# Patient Record
Sex: Male | Born: 1950 | Race: White | Hispanic: No | Smoking: Never smoker
Health system: Southern US, Community
[De-identification: ages and names within clinical notes are randomized; demographics above are authoritative.]

## PROBLEM LIST (undated history)

## (undated) DIAGNOSIS — K5792 Diverticulitis of intestine, part unspecified, without perforation or abscess without bleeding: Secondary | ICD-10-CM

## (undated) DIAGNOSIS — E7800 Pure hypercholesterolemia, unspecified: Secondary | ICD-10-CM

## (undated) HISTORY — DX: Diverticulitis of intestine, part unspecified, without perforation or abscess without bleeding: K57.92

## (undated) HISTORY — PX: TONSILLECTOMY: SUR1361

---

## 2008-08-16 ENCOUNTER — Other Ambulatory Visit: Payer: Self-pay | Admitting: Emergency Medicine

## 2008-08-16 ENCOUNTER — Emergency Department (HOSPITAL_COMMUNITY): Admission: EM | Admit: 2008-08-16 | Discharge: 2008-08-17 | Payer: Self-pay | Admitting: Emergency Medicine

## 2010-05-14 LAB — URINE MICROSCOPIC-ADD ON

## 2010-05-14 LAB — URINALYSIS, ROUTINE W REFLEX MICROSCOPIC
Bilirubin Urine: NEGATIVE
Nitrite: NEGATIVE
Specific Gravity, Urine: 1.026 (ref 1.005–1.030)
Urobilinogen, UA: 1 mg/dL (ref 0.0–1.0)

## 2013-02-24 ENCOUNTER — Encounter (HOSPITAL_COMMUNITY): Payer: Self-pay | Admitting: Emergency Medicine

## 2013-02-24 ENCOUNTER — Emergency Department (HOSPITAL_COMMUNITY)
Admission: EM | Admit: 2013-02-24 | Discharge: 2013-02-24 | Disposition: A | Discharge status: Home / Self Care | Payer: BC Managed Care – PPO | Attending: Emergency Medicine | Admitting: Emergency Medicine

## 2013-02-24 DIAGNOSIS — Z8639 Personal history of other endocrine, nutritional and metabolic disease: Secondary | ICD-10-CM | POA: Insufficient documentation

## 2013-02-24 DIAGNOSIS — R0989 Other specified symptoms and signs involving the circulatory and respiratory systems: Secondary | ICD-10-CM | POA: Insufficient documentation

## 2013-02-24 DIAGNOSIS — R220 Localized swelling, mass and lump, head: Secondary | ICD-10-CM | POA: Insufficient documentation

## 2013-02-24 DIAGNOSIS — Z862 Personal history of diseases of the blood and blood-forming organs and certain disorders involving the immune mechanism: Secondary | ICD-10-CM | POA: Insufficient documentation

## 2013-02-24 DIAGNOSIS — T7840XA Allergy, unspecified, initial encounter: Secondary | ICD-10-CM

## 2013-02-24 DIAGNOSIS — R221 Localized swelling, mass and lump, neck: Principal | ICD-10-CM

## 2013-02-24 DIAGNOSIS — R0609 Other forms of dyspnea: Secondary | ICD-10-CM | POA: Insufficient documentation

## 2013-02-24 DIAGNOSIS — I10 Essential (primary) hypertension: Secondary | ICD-10-CM | POA: Insufficient documentation

## 2013-02-24 DIAGNOSIS — R42 Dizziness and giddiness: Secondary | ICD-10-CM | POA: Insufficient documentation

## 2013-02-24 HISTORY — DX: Pure hypercholesterolemia, unspecified: E78.00

## 2013-02-24 MED ORDER — FAMOTIDINE IN NACL 20-0.9 MG/50ML-% IV SOLN
20.0000 mg | Freq: Once | INTRAVENOUS | Status: AC
  Administered 2013-02-24: 20 mg via INTRAVENOUS
  Filled 2013-02-24: qty 50

## 2013-02-24 MED ORDER — DIPHENHYDRAMINE HCL 25 MG PO TABS: 25.0000 mg | ORAL_TABLET | Freq: Three times a day (TID) | ORAL | Status: DC

## 2013-02-24 MED ORDER — FAMOTIDINE 40 MG PO TABS: 40.0000 mg | ORAL_TABLET | Freq: Every day | ORAL | Status: DC

## 2013-02-24 MED ORDER — METHYLPREDNISOLONE SODIUM SUCC 125 MG IJ SOLR
INTRAMUSCULAR | Status: AC
  Administered 2013-02-24: 125 mg
  Filled 2013-02-24: qty 2

## 2013-02-24 MED ORDER — DIPHENHYDRAMINE HCL 50 MG/ML IJ SOLN
INTRAMUSCULAR | Status: AC
  Administered 2013-02-24: 25 mg
  Filled 2013-02-24: qty 1

## 2013-02-24 MED ORDER — SODIUM CHLORIDE 0.9 % IV BOLUS (SEPSIS)
1000.0000 mL | Freq: Once | INTRAVENOUS | Status: AC
  Administered 2013-02-24: 1000 mL via INTRAVENOUS

## 2013-02-24 NOTE — ED Notes (Signed)
Pt reports tongue swelling and feeling "flushed and itchy" x 15 minutes ago.

## 2013-02-24 NOTE — ED Provider Notes (Signed)
CSN: [id number]     Arrival date & time 02/24/13  2993 History  This chart was scribed for Carmin Muskrat, MD by Rolanda Lundborg, ED Scribe. This patient was seen in room APA14/APA14 and the patient's care was started at 8:11 AM.    Chief Complaint  Patient presents with  . Allergic Reaction   The history is provided by the patient. No language interpreter was used.   HPI Comments: Russell Mckinney is a 63 y.o. male who presents to the Emergency Department complaining of sudden-onset allergic reaction onset 40 minutes ago while playing chess on his computer. He reports moderate tongue swelling, mild difficulty breathing, flushed skin, and lightheadedness. He reports recent illness and took OTC Dayquil last night. He denies doing or consuming anything unusual this morning. He denies any pain. He did not take any medication at home. He has no known allergies. He is otherwise healthy. He is not on any medications currently.      Past Medical History  Diagnosis Date  . High cholesterol    Past Surgical History  Procedure Laterality Date  . Tonsillectomy     No family history on file. History  Substance Use Topics  . Smoking status: Never Smoker   . Smokeless tobacco: Not on file  . Alcohol Use: No    Review of Systems  Constitutional:       Per HPI, otherwise negative  HENT:       Per HPI, otherwise negative  Respiratory:       Per HPI, otherwise negative  Cardiovascular:       Per HPI, otherwise negative  Gastrointestinal: Negative for vomiting.  Endocrine:       Negative aside from HPI  Genitourinary:       Neg aside from HPI   Musculoskeletal:       Per HPI, otherwise negative  Skin: Negative.   Neurological: Negative for syncope.    Allergies  Review of patient's allergies indicates no known allergies.  Home Medications  No current outpatient prescriptions on file. BP 135/83  Pulse 78  Temp(Src) 98 F (36.7 C)  Resp 18  Ht 5' 10.5" (1.791 m)  Wt 192 lb (87.091  kg)  BMI 27.15 kg/m2  SpO2 98% Physical Exam  Nursing note and vitals reviewed. Constitutional: He is oriented to person, place, and time. He appears well-developed. No distress.  HENT:  Head: Normocephalic and atraumatic.  Uvula midline non edematous in the back  Eyes: Conjunctivae and EOM are normal.  Cardiovascular: Normal rate and regular rhythm.   Pulmonary/Chest: Effort normal. No stridor. No respiratory distress.  Abdominal: He exhibits no distension.  Musculoskeletal: He exhibits no edema.  Neurological: He is alert and oriented to person, place, and time.  Skin: Skin is warm and dry.  Psychiatric: He has a normal mood and affect.    ED Course  Procedures (including critical care time) Medications - No data to display  DIAGNOSTIC STUDIES: Oxygen Saturation is 98% on RA, normal by my interpretation.    COORDINATION OF CARE: 8:16 AM- Discussed treatment plan with pt which includes Pepcid, benadryl, solumedrol. Pt agrees to plan.    Labs Review Labs Reviewed - No data to display Imaging Review No results found.  EKG Interpretation   None      11:09 AM Patient in no distress.  Tongue swelling is decreased. MDM  No diagnosis found.  I personally performed the services described in this documentation, which was scribed in my presence. The recorded  information has been reviewed and is accurate.   She presents after a likely allergic reaction.  If the patient has glossal edema, but no posterior oral pharyngeal edema or respiratory distress.  Patient improved subjectively here, had no evidence of decompensation.  Though the precipitant is an unknown, the patient substantial improvement and was appropriate for discharge with outpatient followup as needed.  He voices an understanding of return precautions.  Carmin Muskrat, MD 02/24/13 1110

## 2013-02-24 NOTE — Discharge Instructions (Signed)
As discussed, it is important to monitor your condition carefully, and do not hesitate to return here if you develop new, or concerning changes in your condition.  You may use over the counter medications in lieu of the prescriptions, but please follow the instructions for dosing.

## 2013-11-08 DIAGNOSIS — E781 Pure hyperglyceridemia: Secondary | ICD-10-CM | POA: Insufficient documentation

## 2013-11-08 DIAGNOSIS — R42 Dizziness and giddiness: Secondary | ICD-10-CM | POA: Insufficient documentation

## 2013-11-08 DIAGNOSIS — F528 Other sexual dysfunction not due to a substance or known physiological condition: Secondary | ICD-10-CM | POA: Insufficient documentation

## 2013-11-08 DIAGNOSIS — L723 Sebaceous cyst: Secondary | ICD-10-CM | POA: Insufficient documentation

## 2013-11-08 DIAGNOSIS — D126 Benign neoplasm of colon, unspecified: Secondary | ICD-10-CM | POA: Insufficient documentation

## 2013-11-08 DIAGNOSIS — R220 Localized swelling, mass and lump, head: Secondary | ICD-10-CM | POA: Insufficient documentation

## 2013-11-08 DIAGNOSIS — M25519 Pain in unspecified shoulder: Secondary | ICD-10-CM | POA: Insufficient documentation

## 2013-11-08 DIAGNOSIS — K5900 Constipation, unspecified: Secondary | ICD-10-CM | POA: Insufficient documentation

## 2015-07-28 ENCOUNTER — Encounter (HOSPITAL_COMMUNITY): Payer: Self-pay | Admitting: Emergency Medicine

## 2015-07-28 ENCOUNTER — Emergency Department (HOSPITAL_COMMUNITY)
Admission: EM | Admit: 2015-07-28 | Discharge: 2015-07-28 | Disposition: A | Discharge status: Home / Self Care | Payer: BLUE CROSS/BLUE SHIELD | Attending: Emergency Medicine | Admitting: Emergency Medicine

## 2015-07-28 ENCOUNTER — Emergency Department (HOSPITAL_COMMUNITY): Payer: BLUE CROSS/BLUE SHIELD

## 2015-07-28 DIAGNOSIS — Z79899 Other long term (current) drug therapy: Secondary | ICD-10-CM | POA: Diagnosis not present

## 2015-07-28 DIAGNOSIS — K5733 Diverticulitis of large intestine without perforation or abscess with bleeding: Secondary | ICD-10-CM

## 2015-07-28 DIAGNOSIS — R109 Unspecified abdominal pain: Secondary | ICD-10-CM | POA: Diagnosis present

## 2015-07-28 LAB — CBC WITH DIFFERENTIAL/PLATELET
Basophils Absolute: 0 10*3/uL (ref 0.0–0.1)
Basophils Relative: 0 %
Eosinophils Absolute: 0 10*3/uL (ref 0.0–0.7)
Eosinophils Relative: 0 %
HCT: 42.4 % (ref 39.0–52.0)
Hemoglobin: 14.5 g/dL (ref 13.0–17.0)
Lymphocytes Relative: 15 %
Lymphs Abs: 1.9 10*3/uL (ref 0.7–4.0)
MCH: 30 pg (ref 26.0–34.0)
MCHC: 34.2 g/dL (ref 30.0–36.0)
MCV: 87.8 fL (ref 78.0–100.0)
Monocytes Absolute: 1 10*3/uL (ref 0.1–1.0)
Monocytes Relative: 8 %
Neutro Abs: 9.9 10*3/uL — ABNORMAL HIGH (ref 1.7–7.7)
Neutrophils Relative %: 77 %
Platelets: 188 10*3/uL (ref 150–400)
RBC: 4.83 MIL/uL (ref 4.22–5.81)
RDW: 13 % (ref 11.5–15.5)
WBC: 12.8 10*3/uL — ABNORMAL HIGH (ref 4.0–10.5)

## 2015-07-28 LAB — URINE MICROSCOPIC-ADD ON
Bacteria, UA: NONE SEEN
Squamous Epithelial / LPF: NONE SEEN
WBC, UA: NONE SEEN WBC/hpf (ref 0–5)

## 2015-07-28 LAB — BASIC METABOLIC PANEL
Anion gap: 8 (ref 5–15)
BUN: 10 mg/dL (ref 6–20)
CO2: 28 mmol/L (ref 22–32)
Calcium: 9 mg/dL (ref 8.9–10.3)
Chloride: 101 mmol/L (ref 101–111)
Creatinine, Ser: 1.06 mg/dL (ref 0.61–1.24)
GFR calc Af Amer: >60 mL/min (ref >60)
GFR calc non Af Amer: >60 mL/min (ref >60)
Glucose, Bld: 99 mg/dL (ref 65–99)
Potassium: 3.8 mmol/L (ref 3.5–5.1)
Sodium: 137 mmol/L (ref 135–145)

## 2015-07-28 LAB — URINALYSIS, ROUTINE W REFLEX MICROSCOPIC
Bilirubin Urine: NEGATIVE
Glucose, UA: NEGATIVE mg/dL
Ketones, ur: NEGATIVE mg/dL
Leukocytes, UA: NEGATIVE
Nitrite: NEGATIVE
Protein, ur: NEGATIVE mg/dL
Specific Gravity, Urine: <1.005 — ABNORMAL LOW (ref 1.005–1.030)
pH: 6.5 (ref 5.0–8.0)

## 2015-07-28 MED ORDER — DIATRIZOATE MEGLUMINE & SODIUM 66-10 % PO SOLN
ORAL | Status: AC
  Filled 2015-07-28: qty 30

## 2015-07-28 MED ORDER — METRONIDAZOLE IN NACL 5-0.79 MG/ML-% IV SOLN
500.0000 mg | Freq: Once | INTRAVENOUS | Status: AC
  Administered 2015-07-28: 500 mg via INTRAVENOUS
  Filled 2015-07-28: qty 100

## 2015-07-28 MED ORDER — IOPAMIDOL (ISOVUE-300) INJECTION 61%
100.0000 mL | Freq: Once | INTRAVENOUS | Status: AC | PRN
  Administered 2015-07-28: 100 mL via INTRAVENOUS

## 2015-07-28 MED ORDER — CIPROFLOXACIN HCL 500 MG PO TABS: 500.0000 mg | ORAL_TABLET | Freq: Two times a day (BID) | ORAL | Status: DC

## 2015-07-28 MED ORDER — SODIUM CHLORIDE 0.9 % IV BOLUS (SEPSIS)
1000.0000 mL | Freq: Once | INTRAVENOUS | Status: AC
  Administered 2015-07-28: 1000 mL via INTRAVENOUS

## 2015-07-28 MED ORDER — DICYCLOMINE HCL 10 MG PO CAPS
10.0000 mg | ORAL_CAPSULE | Freq: Once | ORAL | Status: AC
  Administered 2015-07-28: 10 mg via ORAL
  Filled 2015-07-28: qty 1

## 2015-07-28 MED ORDER — KETOROLAC TROMETHAMINE 30 MG/ML IJ SOLN
15.0000 mg | Freq: Once | INTRAMUSCULAR | Status: AC
  Administered 2015-07-28: 15 mg via INTRAVENOUS
  Filled 2015-07-28: qty 1

## 2015-07-28 MED ORDER — CIPROFLOXACIN IN D5W 400 MG/200ML IV SOLN
400.0000 mg | Freq: Once | INTRAVENOUS | Status: AC
  Administered 2015-07-28: 400 mg via INTRAVENOUS
  Filled 2015-07-28: qty 200

## 2015-07-28 MED ORDER — OXYCODONE-ACETAMINOPHEN 5-325 MG PO TABS: 1.0000 | ORAL_TABLET | ORAL | Status: DC | PRN

## 2015-07-28 MED ORDER — METRONIDAZOLE 500 MG PO TABS: 500.0000 mg | ORAL_TABLET | Freq: Three times a day (TID) | ORAL | Status: DC

## 2015-07-28 NOTE — ED Notes (Signed)
Pt verbalized understanding of no driving and to use caution within 4 hours of taking pain meds due to meds cause drowsiness 

## 2015-07-28 NOTE — ED Notes (Signed)
Over the past 4 months pt had abdominal pain and after BM, pain would go away.  For the past 4 days pain has been constant.  Rates pain 2/10 but increases to 6/10.  Last BM 1545, with bright red blood.

## 2015-07-28 NOTE — Discharge Instructions (Signed)
Diverticulitis °Diverticulitis is inflammation or infection of small pouches in your colon that form when you have a condition called diverticulosis. The pouches in your colon are called diverticula. Your colon, or large intestine, is where water is absorbed and stool is formed. °Complications of diverticulitis can include: °· Bleeding. °· Severe infection. °· Severe pain. °· Perforation of your colon. °· Obstruction of your colon. °CAUSES  °Diverticulitis is caused by bacteria. °Diverticulitis happens when stool becomes trapped in diverticula. This allows bacteria to grow in the diverticula, which can lead to inflammation and infection. °RISK FACTORS °People with diverticulosis are at risk for diverticulitis. Eating a diet that does not include enough fiber from fruits and vegetables may make diverticulitis more likely to develop. °SYMPTOMS  °Symptoms of diverticulitis may include: °· Abdominal pain and tenderness. The pain is normally located on the left side of the abdomen, but may occur in other areas. °· Fever and chills. °· Bloating. °· Cramping. °· Nausea. °· Vomiting. °· Constipation. °· Diarrhea. °· Blood in your stool. °DIAGNOSIS  °Your health care provider will ask you about your medical history and do a physical exam. You may need to have tests done because many medical conditions can cause the same symptoms as diverticulitis. Tests may include: °· Blood tests. °· Urine tests. °· Imaging tests of the abdomen, including X-rays and CT scans. °When your condition is under control, your health care provider may recommend that you have a colonoscopy. A colonoscopy can show how severe your diverticula are and whether something else is causing your symptoms. °TREATMENT  °Most cases of diverticulitis are mild and can be treated at home. Treatment may include: °· Taking over-the-counter pain medicines. °· Following a clear liquid diet. °· Taking antibiotic medicines by mouth for 7-10 days. °More severe cases may  be treated at a hospital. Treatment may include: °· Not eating or drinking. °· Taking prescription pain medicine. °· Receiving antibiotic medicines through an IV tube. °· Receiving fluids and nutrition through an IV tube. °· Surgery. °HOME CARE INSTRUCTIONS  °· Follow your health care provider's instructions carefully. °· Follow a full liquid diet or other diet as directed by your health care provider. After your symptoms improve, your health care provider may tell you to change your diet. He or she may recommend you eat a high-fiber diet. Fruits and vegetables are good sources of fiber. Fiber makes it easier to pass stool. °· Take fiber supplements or probiotics as directed by your health care provider. °· Only take medicines as directed by your health care provider. °· Keep all your follow-up appointments. °SEEK MEDICAL CARE IF:  °· Your pain does not improve. °· You have a hard time eating food. °· Your bowel movements do not return to normal. °SEEK IMMEDIATE MEDICAL CARE IF:  °· Your pain becomes worse. °· Your symptoms do not get better. °· Your symptoms suddenly get worse. °· You have a fever. °· You have repeated vomiting. °· You have bloody or black, tarry stools. °MAKE SURE YOU:  °· Understand these instructions. °· Will watch your condition. °· Will get help right away if you are not doing well or get worse. °  °This information is not intended to replace advice given to you by your health care provider. Make sure you discuss any questions you have with your health care provider. °  °Document Released: 10/31/2004 Document Revised: 01/26/2013 Document Reviewed: 12/16/2012 °Elsevier Interactive Patient Education ©2016 [employer] ° °

## 2015-08-03 NOTE — ED Provider Notes (Signed)
CSN: [id number]     Arrival date & time 07/28/15  1649 History   First MD Initiated Contact with Patient 07/28/15 1704     Chief Complaint  Patient presents with  . Abdominal Pain     (Consider location/radiation/quality/duration/timing/severity/associated sxs/prior Treatment) HPI   65 year old male with abdominal pain. He reports that over the last several months he had intermittent abdominal pain. It waxed and waned with no particular rhyme or reason but he did notice that it was often better shortly after having a bowel movement. In the past 4 days though he has had similar but more severe pain. It has not improved with having bowel movements. Is worse with movement. He has also noticed a small amount of bright red blood mixed in with this stool. No urinary complaints. No fevers or chills. Mild nausea, but no vomiting.  Past Medical History  Diagnosis Date  . High cholesterol    Past Surgical History  Procedure Laterality Date  . Tonsillectomy     History reviewed. No pertinent family history. Social History  Substance Use Topics  . Smoking status: Never Smoker   . Smokeless tobacco: None  . Alcohol Use: No    Review of Systems  All systems reviewed and negative, other than as noted in HPI.   Allergies  Review of patient's allergies indicates no known allergies.  Home Medications   Prior to Admission medications   Medication Sig Start Date End Date Taking? Authorizing Provider  senna-docusate (SENOKOT-S) 8.6-50 MG tablet Take 1 tablet by mouth once as needed for mild constipation.   Yes Historical Provider, MD  ciprofloxacin (CIPRO) 500 MG tablet Take 1 tablet (500 mg total) by mouth every 12 (twelve) hours. 07/28/15   Virgel Manifold, MD  metroNIDAZOLE (FLAGYL) 500 MG tablet Take 1 tablet (500 mg total) by mouth 3 (three) times daily. 07/28/15   Virgel Manifold, MD  oxyCODONE-acetaminophen (PERCOCET/ROXICET) 5-325 MG tablet Take 1-2 tablets by mouth every 4 (four) hours as  needed. 07/28/15   Virgel Manifold, MD   BP 104/60 mmHg  Pulse 66  Temp(Src) 98.2 F (36.8 C)  Resp 16  Ht 5\' 10"  (1.778 m)  Wt 190 lb (86.183 kg)  BMI 27.26 kg/m2  SpO2 97% Physical Exam  Constitutional: He appears well-developed and well-nourished. No distress.  HENT:  Head: Normocephalic and atraumatic.  Eyes: Conjunctivae are normal. Right eye exhibits no discharge. Left eye exhibits no discharge.  Neck: Neck supple.  Cardiovascular: Normal rate, regular rhythm and normal heart sounds.  Exam reveals no gallop and no friction rub.   No murmur heard. Pulmonary/Chest: Effort normal and breath sounds normal. No respiratory distress.  Abdominal: Soft. He exhibits no distension. There is tenderness.  Lower abdominal tenderness, somewhat worse suprapubically. No rebound or guarding. No distention.  Musculoskeletal: He exhibits no edema or tenderness.  Neurological: He is alert.  Skin: Skin is warm and dry.  Psychiatric: He has a normal mood and affect. His behavior is normal. Thought content normal.  Nursing note and vitals reviewed.   ED Course  Procedures (including critical care time) Labs Review Labs Reviewed  CBC WITH DIFFERENTIAL/PLATELET - Abnormal; Notable for the following:    WBC 12.8 (*)    Neutro Abs 9.9 (*)    All other components within normal limits  URINALYSIS, ROUTINE W REFLEX MICROSCOPIC (NOT AT [facility]) - Abnormal; Notable for the following:    Specific Gravity, Urine <1.005 (*)    Hgb urine dipstick TRACE (*)  All other components within normal limits  BASIC METABOLIC PANEL  URINE MICROSCOPIC-ADD ON    Imaging Review No results found. I have personally reviewed and evaluated these images and lab results as part of my medical decision-making.   EKG Interpretation None      MDM   Final diagnoses:  Diverticulitis of large intestine without perforation or abscess with bleeding    65 year old male with symptoms, exam and CT consistent with  diverticulitis. No perforation or abscess noted. He is afebrile. No peritonitis. Reports sniffily improved symptoms after medications the emergency room. I feel is appropriate for outpatient treatment. He will be placed on a course of ciprofloxacin, metronidazole and as needed pain medication. Return cautions were discussed. Patient understands the need for follow-up for discussion of possible colonoscopy after he finishes his antibiotics. It has been determined that no acute conditions requiring further emergency intervention are present at this time. The patient has been advised of the diagnosis and plan. I reviewed any labs and imaging including any potential incidental findings. We have discussed signs and symptoms that warrant return to the ED and they are listed in the discharge instructions.    Virgel Manifold, MD 08/03/15 [phone]

## 2016-04-30 DIAGNOSIS — Z79899 Other long term (current) drug therapy: Secondary | ICD-10-CM | POA: Insufficient documentation

## 2016-04-30 DIAGNOSIS — Z7189 Other specified counseling: Secondary | ICD-10-CM | POA: Insufficient documentation

## 2016-04-30 DIAGNOSIS — F4321 Adjustment disorder with depressed mood: Secondary | ICD-10-CM | POA: Insufficient documentation

## 2017-02-27 ENCOUNTER — Encounter (HOSPITAL_BASED_OUTPATIENT_CLINIC_OR_DEPARTMENT_OTHER): Payer: Self-pay | Admitting: *Deleted

## 2017-02-27 ENCOUNTER — Other Ambulatory Visit: Payer: Self-pay

## 2017-02-27 ENCOUNTER — Emergency Department (HOSPITAL_BASED_OUTPATIENT_CLINIC_OR_DEPARTMENT_OTHER)
Admission: EM | Admit: 2017-02-27 | Discharge: 2017-02-27 | Disposition: A | Discharge status: Home / Self Care | Payer: BLUE CROSS/BLUE SHIELD | Attending: Emergency Medicine | Admitting: Emergency Medicine

## 2017-02-27 DIAGNOSIS — K645 Perianal venous thrombosis: Secondary | ICD-10-CM | POA: Insufficient documentation

## 2017-02-27 DIAGNOSIS — K644 Residual hemorrhoidal skin tags: Secondary | ICD-10-CM | POA: Diagnosis not present

## 2017-02-27 DIAGNOSIS — K625 Hemorrhage of anus and rectum: Secondary | ICD-10-CM | POA: Diagnosis present

## 2017-02-27 DIAGNOSIS — Z79899 Other long term (current) drug therapy: Secondary | ICD-10-CM | POA: Diagnosis not present

## 2017-02-27 DIAGNOSIS — K5900 Constipation, unspecified: Secondary | ICD-10-CM | POA: Diagnosis not present

## 2017-02-27 MED ORDER — HYDROCORTISONE 2.5 % RE CREA: TOPICAL_CREAM | RECTAL | 1 refills | Status: DC

## 2017-02-27 NOTE — ED Triage Notes (Signed)
Diarrhea tonight followed by bright red rectal bleeding that he cannot control. Nausea.

## 2017-02-27 NOTE — ED Notes (Signed)
Pt verbalizes understanding of d/c instructions and denies any further needs at this time. 

## 2017-02-27 NOTE — Discharge Instructions (Signed)
Start using fiber, stool softeners or MiraLAX as needed to make her stool softer and prevent straining. Use the cream and do sitz baths. Physician or also you can contact general surgery.If symptoms do not improve you can follow-up with your general

## 2017-02-27 NOTE — ED Provider Notes (Signed)
[facility] Provider Note   CSN: [id number] Arrival date & time: 02/27/17  2114     History   Chief Complaint Chief Complaint  Patient presents with  . Rectal Bleeding    HPI Russell Mckinney is a 67 y.o. male.  The history is provided by the patient.  Rectal Bleeding  Quality:  Bright red Amount:  Copious Duration:  1 hour Timing:  Constant Chronicity:  New Context: constipation, diarrhea and rectal pain   Pain details:    Quality:  Aching   Severity:  Mild   Timing:  Worse with defecation   Progression:  Improving Similar prior episodes: no   Relieved by:  None tried Worsened by:  Wiping Ineffective treatments:  None tried Associated symptoms: no abdominal pain, no dizziness, no fever, no recent illness and no vomiting   Risk factors: no anticoagulant use   Risk factors comment:  Hx of diverticulosis   Past Medical History:  Diagnosis Date  . High cholesterol   . High cholesterol     There are no active problems to display for this patient.   Past Surgical History:  Procedure Laterality Date  . TONSILLECTOMY         Home Medications    Prior to Admission medications   Medication Sig Start Date End Date Taking? Authorizing Provider  SIMVASTATIN PO Take by mouth.   Yes [provider]  ciprofloxacin (CIPRO) 500 MG tablet Take 1 tablet (500 mg total) by mouth every 12 (twelve) hours. 07/28/15   Virgel Manifold, MD  metroNIDAZOLE (FLAGYL) 500 MG tablet Take 1 tablet (500 mg total) by mouth 3 (three) times daily. 07/28/15   Virgel Manifold, MD  oxyCODONE-acetaminophen (PERCOCET/ROXICET) 5-325 MG tablet Take 1-2 tablets by mouth every 4 (four) hours as needed. 07/28/15   Virgel Manifold, MD  senna-docusate (SENOKOT-S) 8.6-50 MG tablet Take 1 tablet by mouth once as needed for mild constipation.    [provider]    Family History No family history on file.  Social History Social History   Tobacco Use  .  Smoking status: Never Smoker  . Smokeless tobacco: Never Used  Substance Use Topics  . Alcohol use: No  . Drug use: No     Allergies   Patient has no known allergies.   Review of Systems Review of Systems  Constitutional: Negative for fever.  Gastrointestinal: Positive for hematochezia. Negative for abdominal pain and vomiting.  Neurological: Negative for dizziness.  All other systems reviewed and are negative.    Physical Exam Updated Vital Signs BP 131/88   Pulse 75   Temp 98.1 F (36.7 C) (Oral)   Resp 16   Ht 5' 10.5" (1.791 m)   Wt 89.8 kg (198 lb)   SpO2 97%   BMI 28.01 kg/m   Physical Exam  Constitutional: He appears well-developed and well-nourished. No distress.  HENT:  Head: Normocephalic and atraumatic.  Eyes: EOM are normal. Pupils are equal, round, and reactive to light.  Cardiovascular: Normal rate.  Pulmonary/Chest: Effort normal.  Abdominal: Soft. He exhibits no distension and no mass. There is no tenderness. There is no guarding.  Genitourinary: Rectal exam shows external hemorrhoid.     Nursing note and vitals reviewed.    ED Treatments / Results  Labs (all labs ordered are listed, but only abnormal results are displayed) Labs Reviewed - No data to display  EKG  EKG Interpretation None       Radiology No results found.  Procedures Procedures (including critical care time)  Medications Ordered in ED Medications - No data to display   Initial Impression / Assessment and Plan / ED Course  I have reviewed the triage vital signs and the nursing notes.  Pertinent labs & imaging results that were available during my care of the patient were reviewed by me and considered in my medical decision making (see chart for details).     Patient is a 67 year old male who is relatively healthy other than hypercholesterolemia presenting today with bright red rectal bleeding.  Patient states that he typically has a lot of constipation and  strains often but had a loose stool today and then bright red blood on the toilet paper that continued to bleed the more he wiped.  Having any trouble urinating and otherwise feels normal.  He does not take anticoagulation.  On exam patient has numerous large hemorrhoids none of which are thrombosed.  Currently hemostatic.  Sent home to do sits baths and given her Anusol cream.  Final Clinical Impressions(s) / ED Diagnoses   Final diagnoses:  External hemorrhoids without complication    ED Discharge Orders    None       Blanchie Dessert, MD 02/27/17 2210

## 2017-03-17 DIAGNOSIS — E781 Pure hyperglyceridemia: Secondary | ICD-10-CM | POA: Diagnosis not present

## 2017-03-17 DIAGNOSIS — F4321 Adjustment disorder with depressed mood: Secondary | ICD-10-CM | POA: Diagnosis not present

## 2017-03-17 DIAGNOSIS — Z79899 Other long term (current) drug therapy: Secondary | ICD-10-CM | POA: Diagnosis not present

## 2017-04-09 DIAGNOSIS — Z0000 Encounter for general adult medical examination without abnormal findings: Secondary | ICD-10-CM | POA: Diagnosis not present

## 2017-07-30 DIAGNOSIS — Z6827 Body mass index (BMI) 27.0-27.9, adult: Secondary | ICD-10-CM | POA: Diagnosis not present

## 2017-07-30 DIAGNOSIS — D126 Benign neoplasm of colon, unspecified: Secondary | ICD-10-CM | POA: Diagnosis not present

## 2017-08-20 DIAGNOSIS — D3132 Benign neoplasm of left choroid: Secondary | ICD-10-CM | POA: Diagnosis not present

## 2017-09-04 DIAGNOSIS — Z886 Allergy status to analgesic agent status: Secondary | ICD-10-CM | POA: Diagnosis not present

## 2017-09-04 DIAGNOSIS — K5730 Diverticulosis of large intestine without perforation or abscess without bleeding: Secondary | ICD-10-CM | POA: Diagnosis not present

## 2017-09-04 DIAGNOSIS — Z86010 Personal history of colonic polyps: Secondary | ICD-10-CM | POA: Diagnosis not present

## 2017-09-04 DIAGNOSIS — K644 Residual hemorrhoidal skin tags: Secondary | ICD-10-CM | POA: Diagnosis not present

## 2017-09-04 DIAGNOSIS — K219 Gastro-esophageal reflux disease without esophagitis: Secondary | ICD-10-CM | POA: Diagnosis not present

## 2017-09-04 DIAGNOSIS — K640 First degree hemorrhoids: Secondary | ICD-10-CM | POA: Diagnosis not present

## 2017-09-04 DIAGNOSIS — K5700 Diverticulitis of small intestine with perforation and abscess without bleeding: Secondary | ICD-10-CM | POA: Diagnosis not present

## 2017-09-04 DIAGNOSIS — E785 Hyperlipidemia, unspecified: Secondary | ICD-10-CM | POA: Diagnosis not present

## 2017-09-04 DIAGNOSIS — Z1211 Encounter for screening for malignant neoplasm of colon: Secondary | ICD-10-CM | POA: Diagnosis not present

## 2017-09-04 DIAGNOSIS — Z79899 Other long term (current) drug therapy: Secondary | ICD-10-CM | POA: Diagnosis not present

## 2017-09-04 DIAGNOSIS — Z09 Encounter for follow-up examination after completed treatment for conditions other than malignant neoplasm: Secondary | ICD-10-CM | POA: Diagnosis not present

## 2017-09-04 DIAGNOSIS — K5792 Diverticulitis of intestine, part unspecified, without perforation or abscess without bleeding: Secondary | ICD-10-CM | POA: Diagnosis not present

## 2017-11-09 IMAGING — CT CT ABD-PELV W/ CM
2 of 9 series · 15 of 46 positions shown, 17 images · IV contrast (iopamidol)
Comparison: None

CLINICAL DATA: Lower abdominal pain intermittently radiating to
rectum for 4 months consistent over last 3-4 days, rectal bleeding,
question diverticulitis

EXAM:
CT ABDOMEN AND PELVIS WITH CONTRAST
TECHNIQUE: Multidetector CT imaging of the abdomen and pelvis was performed
using the standard protocol following bolus administration of
intravenous contrast. Sagittal and coronal MPR images reconstructed
from axial data set.
CONTRAST:  100mL LE420C-NQQ IOPAMIDOL (LE420C-NQQ) INJECTION 61% IV.
Dilute oral contrast.

[Series 2: routine abd pel with · axial · 0.81mm/px · z∈[-418,+6]mm · 12 of 101 slices shown, 14 images]
[im 8/101  soft-tissue]
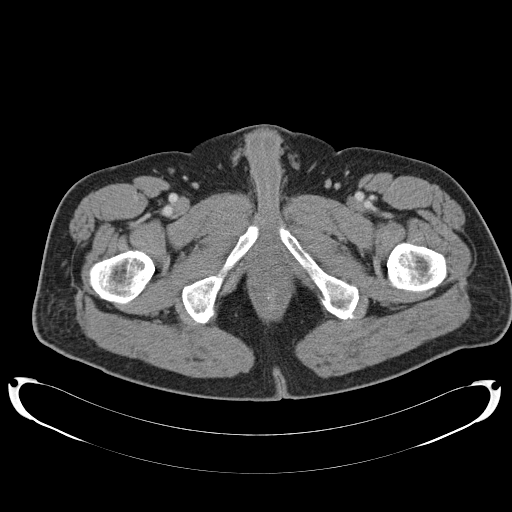
[im 8/101  bone]
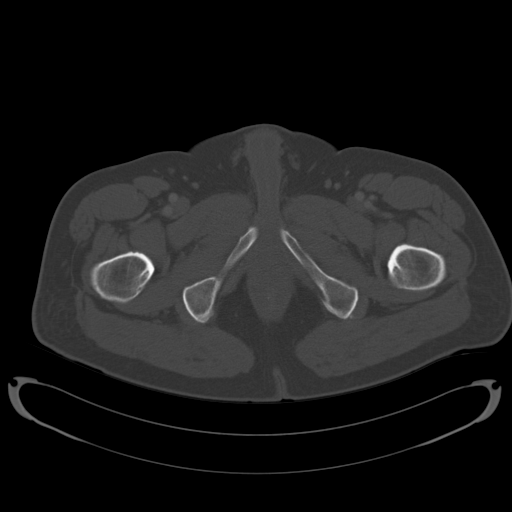
[im 15/101  soft-tissue]
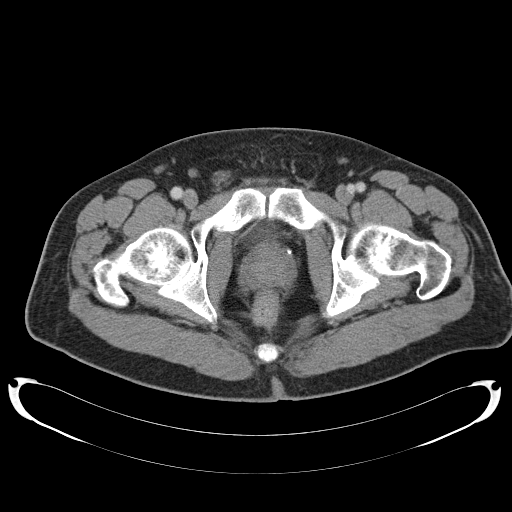
[im 22/101  soft-tissue]
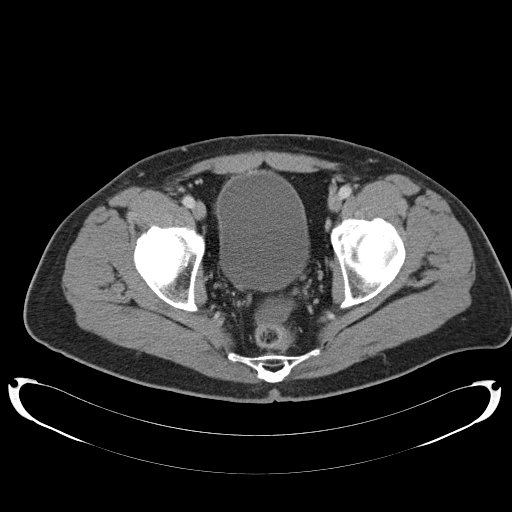
[im 29/101  soft-tissue]
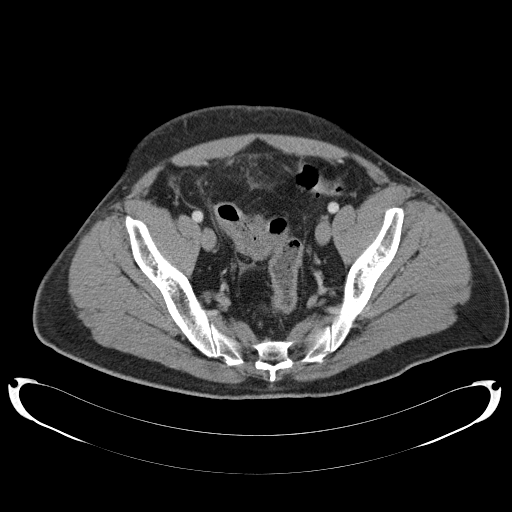
[im 36/101  soft-tissue]
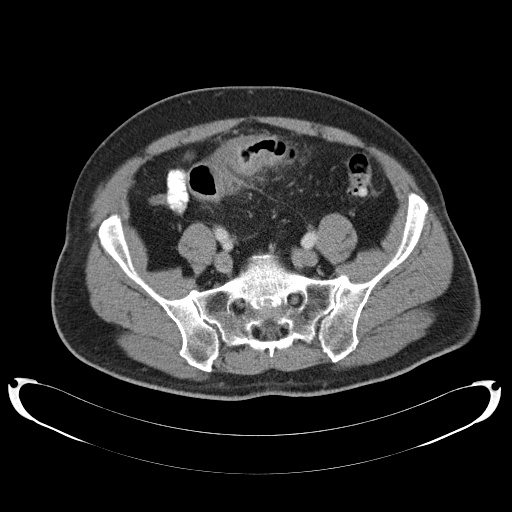
[im 43/101  soft-tissue]
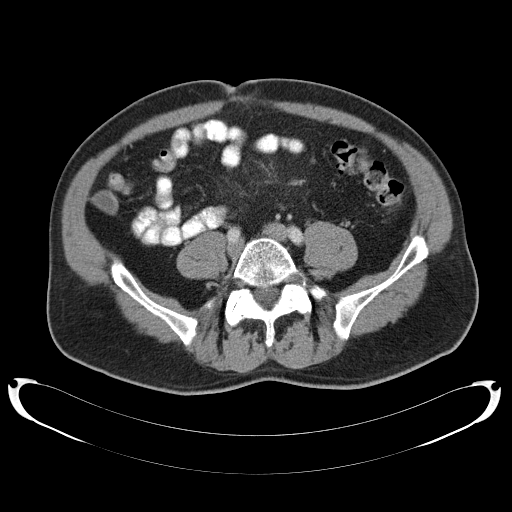
[im 58/101  soft-tissue]
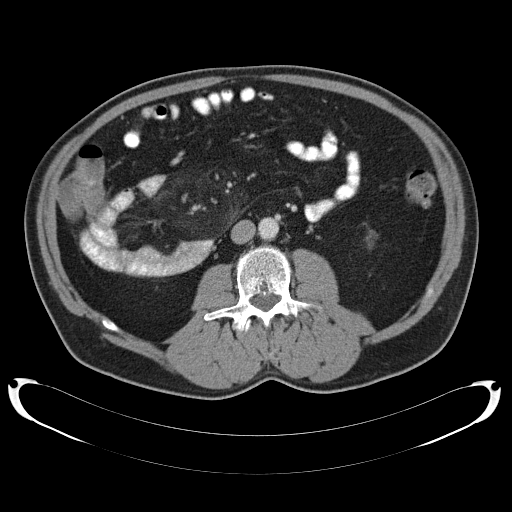
[im 65/101  soft-tissue]
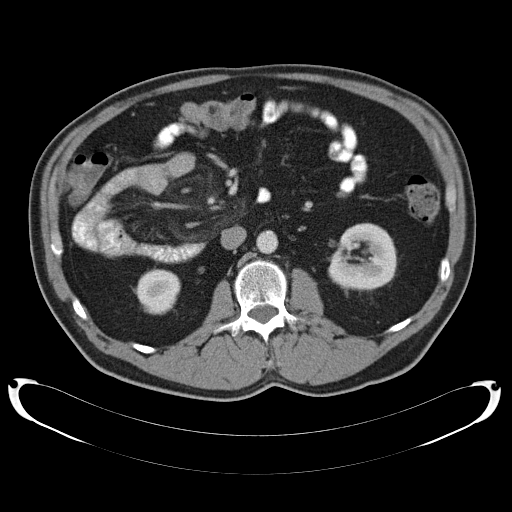
[im 72/101  soft-tissue]
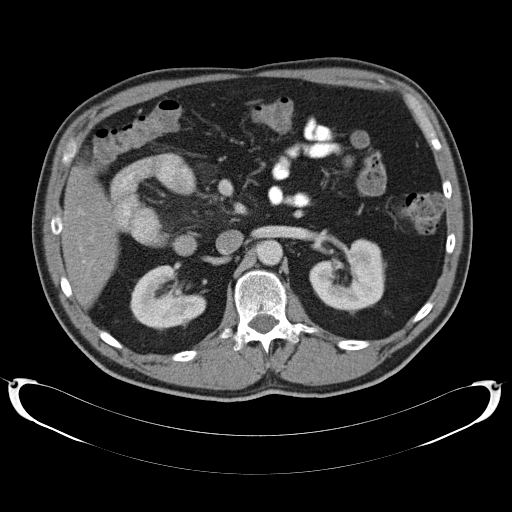
[im 72/101  bone]
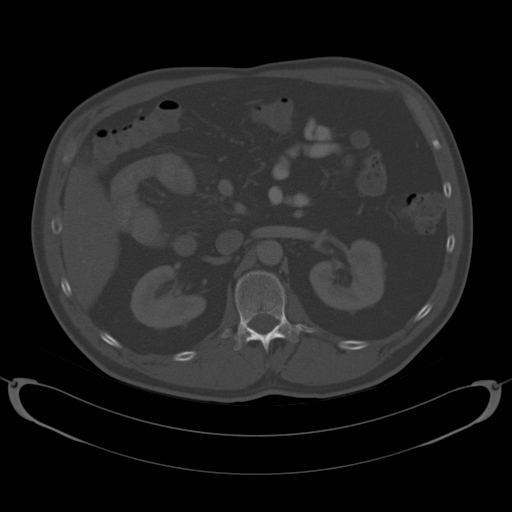
[im 79/101  soft-tissue]
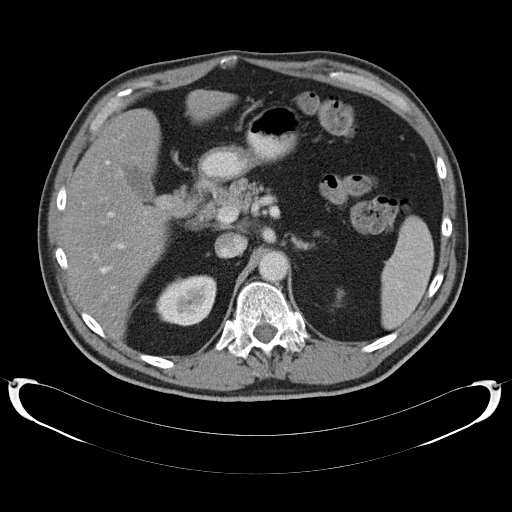
[im 86/101  soft-tissue]
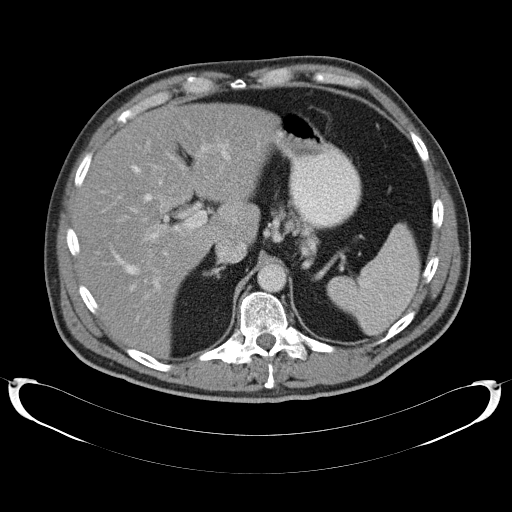
[im 93/101  soft-tissue]
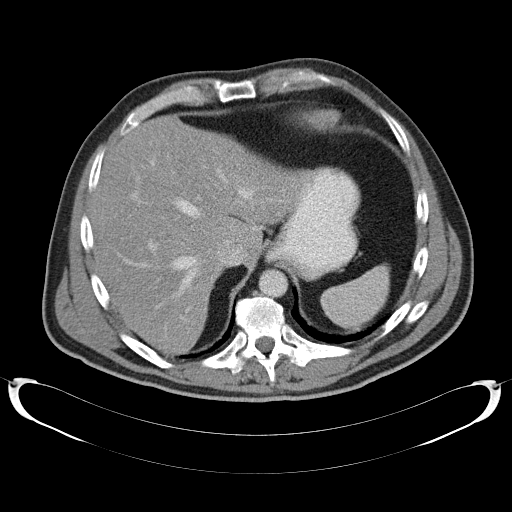

[Series 4: coronal · coronal · 0.72mm/px · 3 of 162 slices shown]
[im 41/162  soft-tissue]
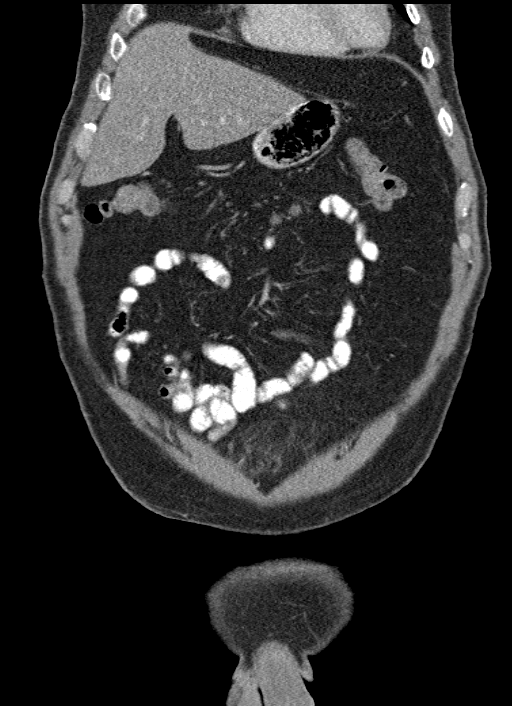
[im 81/162  soft-tissue]
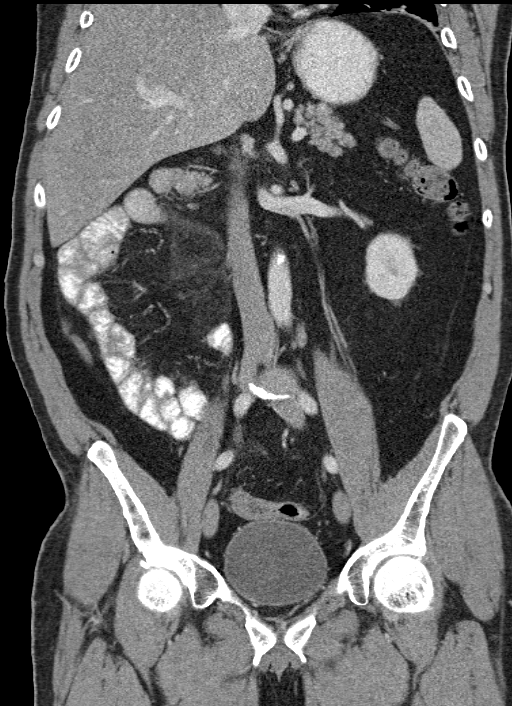
[im 121/162  soft-tissue]
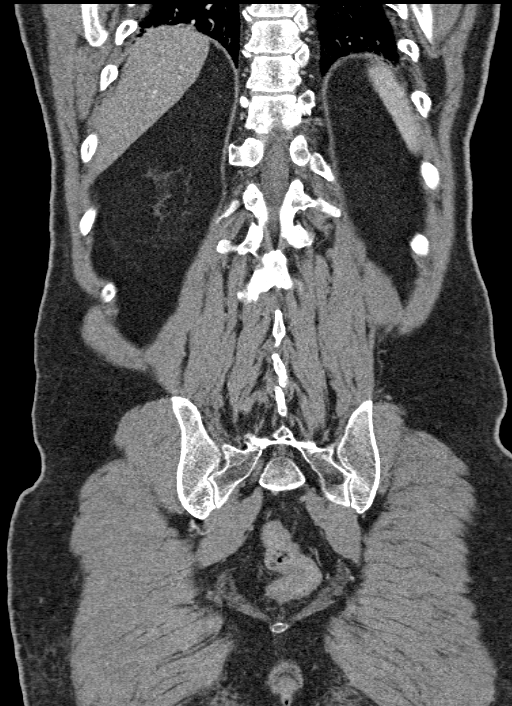

[15 of 46 positions shown; findings below may reference images not displayed]

FINDINGS: Lower chest:  Minimal dependent atelectasis RIGHT lower lobe.

Hepatobiliary: Diffuse fatty infiltration of liver. Liver and
gallbladder otherwise unremarkable.

Pancreas: Normal appearance

Spleen: Normal appearance

Adrenals/Urinary Tract: Adrenal glands normal appearance. Kidneys
normal appearance without mass or hydronephrosis. Normal appearing
bladder, ureters, and prostate gland.

Stomach/Bowel: Scattered sigmoid diverticula. Focal wall thickening
of sigmoid colon with pericolic inflammatory changes consistent with
acute diverticulitis. Stranding in adjacent sigmoid mesocolon
without discrete abscess or gas. Due to the focal nature of the
observed sigmoid wall thickening, unable to exclude colonic
neoplasm.

Vascular/Lymphatic: No adenopathy. Minimal atherosclerotic
calcification aorta.

Reproductive: N/A

Other: Tiny umbilical hernia containing fat. Fatty LEFT inguinal
hernia containing fat. Small mild nonspecific free pelvic fluid. No
free intraperitoneal air or extraluminal gas.

Musculoskeletal: Bones unremarkable
IMPRESSION: Sigmoid diverticulosis with sigmoid wall thickening and pericolic
inflammatory changes consistent with acute diverticulitis.

Small amount of nonspecific free pelvic fluid without evidence of
extraluminal gas.

Follow-up colonoscopy following resolution of the current acute
inflammatory process is recommended to exclude underlying colonic
neoplasm.

Hepatic steatosis.

Fibrosing mesenteritis of small bowel mesentery.

Tiny LEFT inguinal and umbilical hernias containing fat.

Aortic atherosclerosis.

## 2017-12-05 DIAGNOSIS — Z79899 Other long term (current) drug therapy: Secondary | ICD-10-CM | POA: Diagnosis not present

## 2017-12-05 DIAGNOSIS — Z23 Encounter for immunization: Secondary | ICD-10-CM | POA: Diagnosis not present

## 2017-12-05 DIAGNOSIS — E781 Pure hyperglyceridemia: Secondary | ICD-10-CM | POA: Diagnosis not present

## 2017-12-05 DIAGNOSIS — N528 Other male erectile dysfunction: Secondary | ICD-10-CM | POA: Diagnosis not present

## 2017-12-05 DIAGNOSIS — Z5181 Encounter for therapeutic drug level monitoring: Secondary | ICD-10-CM | POA: Diagnosis not present

## 2017-12-14 ENCOUNTER — Other Ambulatory Visit: Payer: Self-pay

## 2017-12-14 ENCOUNTER — Encounter (HOSPITAL_BASED_OUTPATIENT_CLINIC_OR_DEPARTMENT_OTHER): Payer: Self-pay | Admitting: *Deleted

## 2017-12-14 ENCOUNTER — Emergency Department (HOSPITAL_BASED_OUTPATIENT_CLINIC_OR_DEPARTMENT_OTHER)
Admission: EM | Admit: 2017-12-14 | Discharge: 2017-12-15 | Disposition: A | Discharge status: Home / Self Care | Payer: Worker's Compensation | Attending: Emergency Medicine | Admitting: Emergency Medicine

## 2017-12-14 DIAGNOSIS — S60862A Insect bite (nonvenomous) of left wrist, initial encounter: Secondary | ICD-10-CM | POA: Diagnosis present

## 2017-12-14 DIAGNOSIS — Z79899 Other long term (current) drug therapy: Secondary | ICD-10-CM | POA: Diagnosis not present

## 2017-12-14 DIAGNOSIS — W57XXXA Bitten or stung by nonvenomous insect and other nonvenomous arthropods, initial encounter: Secondary | ICD-10-CM | POA: Insufficient documentation

## 2017-12-14 DIAGNOSIS — Y990 Civilian activity done for income or pay: Secondary | ICD-10-CM | POA: Diagnosis not present

## 2017-12-14 DIAGNOSIS — Y939 Activity, unspecified: Secondary | ICD-10-CM | POA: Diagnosis not present

## 2017-12-14 DIAGNOSIS — Y9259 Other trade areas as the place of occurrence of the external cause: Secondary | ICD-10-CM | POA: Diagnosis not present

## 2017-12-14 NOTE — ED Triage Notes (Signed)
Pt reports bitten by ?spider this eveinng around 945pm. Swelling and erythema noted to left inner wrist

## 2017-12-15 MED ORDER — DIPHENHYDRAMINE HCL 25 MG PO TABS: 25.0000 mg | ORAL_TABLET | Freq: Three times a day (TID) | ORAL | 0 refills | Status: DC | PRN

## 2017-12-15 MED ORDER — HYDROXYZINE HCL 25 MG PO TABS
25.0000 mg | ORAL_TABLET | Freq: Once | ORAL | Status: AC
  Administered 2017-12-15: 25 mg via ORAL
  Filled 2017-12-15: qty 1

## 2017-12-15 NOTE — ED Provider Notes (Signed)
[facility] EMERGENCY DEPARTMENT Provider Note   CSN: [id number] Arrival date & time: 12/14/17  2331     History   Chief Complaint Chief Complaint  Patient presents with  . Insect Bite    HPI Russell Mckinney is a 67 y.o. male.  HPI  This is a 67 year old male who presents with insect bite to the left wrist.  Patient reports that he was at work earlier today when he felt something bite him.  He had progressive itching and swelling of the left wrist.  He reports that it is mostly itchy.  He has not taken anything for his symptoms.  Initially he states that it looked like a bite and progressed to a much larger lesion.  He denies any significant pain.  He has normal range of motion of the wrist.  Past Medical History:  Diagnosis Date  . High cholesterol   . High cholesterol     There are no active problems to display for this patient.   Past Surgical History:  Procedure Laterality Date  . TONSILLECTOMY          Home Medications    Prior to Admission medications   Medication Sig Start Date End Date Taking? Authorizing Provider  SIMVASTATIN PO Take by mouth.   Yes [provider]  ciprofloxacin (CIPRO) 500 MG tablet Take 1 tablet (500 mg total) by mouth every 12 (twelve) hours. 07/28/15   Virgel Manifold, MD  diphenhydrAMINE (BENADRYL) 25 MG tablet Take 1 tablet (25 mg total) by mouth every 8 (eight) hours as needed for itching. 12/15/17   Horton, Barbette Hair, MD  hydrocortisone (ANUSOL-HC) 2.5 % rectal cream Apply rectally 2 times daily 02/27/17   Blanchie Dessert, MD  metroNIDAZOLE (FLAGYL) 500 MG tablet Take 1 tablet (500 mg total) by mouth 3 (three) times daily. 07/28/15   Virgel Manifold, MD  oxyCODONE-acetaminophen (PERCOCET/ROXICET) 5-325 MG tablet Take 1-2 tablets by mouth every 4 (four) hours as needed. 07/28/15   Virgel Manifold, MD  senna-docusate (SENOKOT-S) 8.6-50 MG tablet Take 1 tablet by mouth once as needed for mild constipation.    [provider]    Family History No family history on file.  Social History Social History   Tobacco Use  . Smoking status: Never Smoker  . Smokeless tobacco: Never Used  Substance Use Topics  . Alcohol use: No  . Drug use: No     Allergies   Patient has no known allergies.   Review of Systems Review of Systems  Constitutional: Negative for fever.  Skin: Positive for rash.  All other systems reviewed and are negative.    Physical Exam Updated Vital Signs BP 124/70 (BP Location: Left Arm)   Pulse 70   Temp 97.7 F (36.5 C) (Oral)   Resp 18   Ht 1.791 m (5' 10.5")   Wt 83.5 kg   SpO2 100%   BMI 26.03 kg/m   Physical Exam  Constitutional: He is oriented to person, place, and time. He appears well-developed and well-nourished. No distress.  HENT:  Head: Normocephalic and atraumatic.  Eyes: Pupils are equal, round, and reactive to light.  Cardiovascular: Normal rate and regular rhythm.  Pulmonary/Chest: Effort normal. No respiratory distress.  Musculoskeletal: He exhibits no edema.  Neurological: He is alert and oriented to person, place, and time.  Skin: Skin is warm and dry.  Raised urticarial lesion noted over the palmar aspect of the left wrist extending 5 x 2 cm, no obvious bite site,  no significant erythema  Psychiatric: He has a normal mood and affect.  Nursing note and vitals reviewed.    ED Treatments / Results  Labs (all labs ordered are listed, but only abnormal results are displayed) Labs Reviewed - No data to display  EKG None  Radiology No results found.  Procedures Procedures (including critical care time)  Medications Ordered in ED Medications  hydrOXYzine (ATARAX/VISTARIL) tablet 25 mg (has no administration in time range)     Initial Impression / Assessment and Plan / ED Course  I have reviewed the triage vital signs and the nursing notes.  Pertinent labs & imaging results that were available during my care of the patient  were reviewed by me and considered in my medical decision making (see chart for details).     Presents with reported bite and local reaction.  He is overall nontoxic-appearing.  No other signs of allergic reaction.  He appears to have a local allergic response.  No significant bite site noted.  Ice was applied and patient was given Atarax.  Recommend supportive measures with Benadryl at home.  Low suspicion for infection or cellulitis.  After history, exam, and medical workup I feel the patient has been appropriately medically screened and is safe for discharge home. Pertinent diagnoses were discussed with the patient. Patient was given return precautions.     Final Clinical Impressions(s) / ED Diagnoses   Final diagnoses:  Insect bite of left wrist, initial encounter    ED Discharge Orders         Ordered    diphenhydrAMINE (BENADRYL) 25 MG tablet  Every 8 hours PRN     12/15/17 0100           Horton, Barbette Hair, MD 12/15/17 0130

## 2018-08-06 ENCOUNTER — Other Ambulatory Visit: Payer: Self-pay

## 2018-08-06 ENCOUNTER — Encounter (INDEPENDENT_AMBULATORY_CARE_PROVIDER_SITE_OTHER): Payer: Self-pay

## 2018-08-06 ENCOUNTER — Encounter: Payer: Self-pay | Admitting: Family Medicine

## 2018-08-06 ENCOUNTER — Ambulatory Visit (INDEPENDENT_AMBULATORY_CARE_PROVIDER_SITE_OTHER): Payer: BC Managed Care – PPO | Admitting: Family Medicine

## 2018-08-06 VITALS — BP 118/68 | HR 61 | Temp 98.5°F | Resp 12 | Ht 70.5 in | Wt 180.0 lb

## 2018-08-06 DIAGNOSIS — F418 Other specified anxiety disorders: Secondary | ICD-10-CM | POA: Diagnosis not present

## 2018-08-06 DIAGNOSIS — N528 Other male erectile dysfunction: Secondary | ICD-10-CM | POA: Diagnosis not present

## 2018-08-06 DIAGNOSIS — K644 Residual hemorrhoidal skin tags: Secondary | ICD-10-CM | POA: Diagnosis not present

## 2018-08-06 DIAGNOSIS — E785 Hyperlipidemia, unspecified: Secondary | ICD-10-CM | POA: Diagnosis not present

## 2018-08-06 MED ORDER — BUSPIRONE HCL 5 MG PO TABS: 5.0000 mg | ORAL_TABLET | Freq: Three times a day (TID) | ORAL | 1 refills | Status: DC

## 2018-08-06 NOTE — Progress Notes (Signed)
Subjective:     Patient ID: Russell Mckinney, male   DOB: January 03, 1951, 68 y.o.   MRN: [id number]  Russell Mckinney presents for New Patient (Initial Visit) (establish care)  [name] comes in today to establish care closer to home.  Previously seen by Dr. Pricilla Holm in [facility].  Had moved out this way several years back.  And does not want to commute anymore and [place] for his primary care.  Overall relatively healthy.  Only history that is seen is elevated cholesterol, erectile dysfunction, and external hemorrhoids.  Slightly overweight with a BMI of 25.  Lives with wife of 13 years.  Anniversary is actually coming up on July 7.  They have no pets.  He has no children with this wife.  Was previously married no children from that marriage either.  Current wife does have 4 children from previous marriage.  Reports good relationship.  Reports that he eats a pretty good balanced diet.  Enjoys meats, veggies, fruits as well.  Does not drink coffee but does drink 1 Mountain Dew on the 4 days of the week that he does work.  Reports he only drinks about 2 cups if that daily of water.  He knows that he needs to increase his water intake.  Denies tobacco and alcohol use.  Family history is pertinent for diabetes in a brother.  Otherwise most of his family was overall healthy.  He does not recall anything outstanding report.  Today in the office he overall is feeling okay outside the fact that he feels depressed currently because he is about to retire in the next few months.  Though he wants to retire and stop working at this job that he has been out for 26 years.  He is anxious about what is to come.  He also reports that he has anxiety about the way of the world right now.  In the stress with everything going on with the virus.  Additionally, he reports his appetite is changed some.  Just because he is trying to eat better.  He denies having any signs or symptoms of COVID.  He reports that he has some dizziness  when he is changing positions at work.  But he reports that this subsides.  Possibly could be related to the fact that he does not drink enough water.  We will need to review his paperwork in the system to see if he has had hepatitis and HIV screening.  Along with other vaccinations.  Today patient denies signs and symptoms of COVID 19 infection including fever, chills, cough, shortness of breath, and headache.  Past Medical, Surgical, Social History, Allergies, and Medications have been Reviewed.   Past Medical History:  Diagnosis Date  . High cholesterol   . High cholesterol    Past Surgical History:  Procedure Laterality Date  . TONSILLECTOMY     Social History   Socioeconomic History  . Marital status: Married    Spouse name: Not on file  . Number of children: Not on file  . Years of education: Not on file  . Highest education level: Not on file  Occupational History  . Not on file  Social Needs  . Financial resource strain: Not on file  . Food insecurity    Worry: Not on file    Inability: Not on file  . Transportation needs    Medical: Not on file    Non-medical: Not on file  Tobacco Use  . Smoking  status: Never Smoker  . Smokeless tobacco: Never Used  Substance and Sexual Activity  . Alcohol use: No  . Drug use: No  . Sexual activity: Not on file  Lifestyle  . Physical activity    Days per week: Not on file    Minutes per session: Not on file  . Stress: Not on file  Relationships  . Social Herbalist on phone: Not on file    Gets together: Not on file    Attends religious service: Not on file    Active member of club or organization: Not on file    Attends meetings of clubs or organizations: Not on file    Relationship status: Not on file  . Intimate partner violence    Fear of current or ex partner: Not on file    Emotionally abused: Not on file    Physically abused: Not on file    Forced sexual activity: Not on file  Other Topics Concern   . Not on file  Social History Narrative  . Not on file    Outpatient Encounter Medications as of 08/06/2018  Medication Sig  . diphenhydrAMINE (BENADRYL) 25 MG tablet Take 1 tablet (25 mg total) by mouth every 8 (eight) hours as needed for itching.  Marland Kitchen EPINEPHrine 0.3 mg/0.3 mL IJ SOAJ injection Inject 0.3 mg into the skin as directed.  . hydrocortisone (ANUSOL-HC) 2.5 % rectal cream Apply rectally 2 times daily  . senna-docusate (SENOKOT-S) 8.6-50 MG tablet Take 1 tablet by mouth once as needed for mild constipation.  . sildenafil (REVATIO) 20 MG tablet Take 20-100 mg by mouth as directed.  . simvastatin (ZOCOR) 20 MG tablet Take 20 mg by mouth daily.  . [DISCONTINUED] ciprofloxacin (CIPRO) 500 MG tablet Take 1 tablet (500 mg total) by mouth every 12 (twelve) hours. (Patient not taking: Reported on 08/06/2018)  . [DISCONTINUED] metroNIDAZOLE (FLAGYL) 500 MG tablet Take 1 tablet (500 mg total) by mouth 3 (three) times daily. (Patient not taking: Reported on 08/06/2018)  . [DISCONTINUED] oxyCODONE-acetaminophen (PERCOCET/ROXICET) 5-325 MG tablet Take 1-2 tablets by mouth every 4 (four) hours as needed. (Patient not taking: Reported on 08/06/2018)  . [DISCONTINUED] SIMVASTATIN PO Take by mouth.   No facility-administered encounter medications on file as of 08/06/2018.    Allergies  Allergen Reactions  . Codeine Other (See Comments)    Other reaction(s): Other (See Comments) GI Upset  GI Upset  GI Upset      Review of Systems  Constitutional: Positive for appetite change. Negative for activity change, chills and fever.       Retirement is coming, 4 months and is getting nervous  HENT:       Dentist twice a year, partial   Eyes: Negative for visual disturbance.       Needs an appt for eye dr  Respiratory: Negative for cough and shortness of breath.   Cardiovascular: Negative.   Gastrointestinal: Negative.   Endocrine: Negative for polydipsia, polyphagia and polyuria.  Genitourinary:  Negative.   Musculoskeletal: Negative.   Skin: Negative.   Allergic/Immunologic: Negative.   Neurological: Positive for dizziness. Negative for headaches.  Hematological: Negative.   Psychiatric/Behavioral: The patient is nervous/anxious.   All other systems reviewed and are negative.      Objective:     BP 118/68   Pulse 61   Temp 98.5 F (36.9 C) (Oral)   Resp 12   Ht 5' 10.5" (1.791 m)   Wt 180 lb 0.6  oz (81.7 kg)   SpO2 97%   BMI 25.47 kg/m   Physical Exam Vitals signs and nursing note reviewed.  Constitutional:      Appearance: Normal appearance. He is well-developed, well-groomed and overweight.  HENT:     Head: Normocephalic and atraumatic.     Right Ear: External ear normal.     Left Ear: External ear normal.     Nose: Nose normal.  Eyes:     General:        Right eye: No discharge.        Left eye: No discharge.     Conjunctiva/sclera: Conjunctivae normal.     Comments: glasses  Neck:     Musculoskeletal: Normal range of motion and neck supple.  Cardiovascular:     Rate and Rhythm: Normal rate.     Pulses: Normal pulses.     Heart sounds: Normal heart sounds.  Pulmonary:     Effort: Pulmonary effort is normal.     Breath sounds: Normal breath sounds.  Musculoskeletal: Normal range of motion.  Skin:    General: Skin is warm.     Capillary Refill: Capillary refill takes less than 2 seconds.  Neurological:     Mental Status: He is alert and oriented to person, place, and time.  Psychiatric:        Attention and Perception: Attention and perception normal.        Mood and Affect: Mood normal.        Behavior: Behavior normal. Behavior is cooperative.        Thought Content: Thought content normal.        Cognition and Memory: Cognition normal.        Judgment: Judgment normal.        Assessment and Plan        1. Situational anxiety Not controlled, referral to therapy. He reports he would like to talk with someone. Also provided with Buspar  if needed in meantime. Provided with education on self care as well.   Reviewed side effects, risks and benefits of medication.   Patient acknowledged agreement and understanding of the plan.   - busPIRone (BUSPAR) 5 MG tablet; Take 1 tablet (5 mg total) by mouth 3 (three) times daily.  Dispense: 60 tablet; Refill: 1 - Ambulatory referral to [facility]  2. Hyperlipidemia, unspecified hyperlipidemia type Controlled, continue current medication regimen.   No refills needed.   3. Other male erectile dysfunction Controlled, continue current medication regimen.  No refills needed.   4. External hemorrhoid Controlled, continue current medication regimen. No refills needed.   Follow Up: 1 months   Perlie Mayo, DNP, AGNP-BC [address], [address] [place], [place] [address] Office Hours: Mon-Thurs 8 am-5 pm; Fri 8 am-12 pm Office Phone:  [phone]  Office Fax: [phone]

## 2018-08-06 NOTE — Patient Instructions (Signed)
   [facility]  Thank you for coming into the office today. I appreciate the opportunity to provide you with the care for your health and wellness. Today we discussed: overall health  FOLLOW UP: 1 month  Refer to therapy in place. New medication ordered to [facility], take as directed.  Please try to get outside when you feel stressed or need down time. Take walks and do things you enjoy.  Please continue to practice social distancing to keep you, your family, and our community safe.  If you must go out, please wear a Mask and practice good handwashing.  [place] YOUR HANDS WELL AND FREQUENTLY. AVOID TOUCHING YOUR FACE, UNLESS YOUR HANDS ARE FRESHLY WASHED.  GET FRESH AIR DAILY. STAY HYDRATED WITH WATER.   It was a pleasure to see you and I look forward to continuing to work together on your health and well-being. Please do not hesitate to call the office if you need care or have questions about your care.  Have a wonderful day and weekend.  With Gratitude,  Cherly Beach, DNP, AGNP-BC

## 2018-09-07 ENCOUNTER — Ambulatory Visit (HOSPITAL_COMMUNITY): Payer: BC Managed Care – PPO | Admitting: Licensed Clinical Social Worker

## 2018-09-07 ENCOUNTER — Other Ambulatory Visit: Payer: Self-pay

## 2018-09-10 ENCOUNTER — Encounter: Payer: Self-pay | Admitting: Family Medicine

## 2018-09-10 ENCOUNTER — Other Ambulatory Visit: Payer: Self-pay

## 2018-09-10 ENCOUNTER — Ambulatory Visit (INDEPENDENT_AMBULATORY_CARE_PROVIDER_SITE_OTHER): Payer: BC Managed Care – PPO | Admitting: Family Medicine

## 2018-09-10 VITALS — BP 104/56 | HR 76 | Temp 98.1°F | Resp 12 | Ht 70.0 in | Wt 181.0 lb

## 2018-09-10 DIAGNOSIS — G479 Sleep disorder, unspecified: Secondary | ICD-10-CM

## 2018-09-10 DIAGNOSIS — N528 Other male erectile dysfunction: Secondary | ICD-10-CM

## 2018-09-10 DIAGNOSIS — F418 Other specified anxiety disorders: Secondary | ICD-10-CM | POA: Diagnosis not present

## 2018-09-10 MED ORDER — BUSPIRONE HCL 5 MG PO TABS: 5.0000 mg | ORAL_TABLET | Freq: Three times a day (TID) | ORAL | 1 refills | Status: DC

## 2018-09-10 NOTE — Progress Notes (Signed)
Subjective:     Patient ID: Russell Mckinney, male   DOB: 1951/01/22, 68 y.o.   MRN: [id number]  Russell Mckinney presents for situational anxiety (1 month follow up)  Today I am follow up with Russell Mckinney regarding his anxiety. I saw him for established care last month and at that time he has anxiety regarding retirement and home life. I referred him to [facility] as he stated talking to someone would help him. I also gave him a prescription for buspar.  Today he reports buspar helps a lot. He takes 1 in the morning and 1 before bed. He reports missing the call for therapy. He tried calling back but no answer.  He reports he is on vacation from work right now and will be a lot of the next few months as he has 10 weeks of vacation to use before retiring. He thinks the retirement might happen around [place], but reports he will stay on if they ask.  He reports overall doing better but is waking up at night around 2-3 am anxious. He started taking 10mg  Melatonin at night to help him fall asleep. He wondered if he should take another buspar at that time or more melatonin.  Additionally, he reports he is anxious has he got a speeding ticket last year and has not been able to get community service due to [place]. He is worried he will lose his licenses and that would make life much more difficult.   He reports his wife still wants to more back into a city and he wonders why she married him since he [place] the country.  Overall he does think he is better. Just some more pressing concerns have surfaced. He denies feeling sick today in the office.   Today patient denies signs and symptoms of COVID 19 infection including fever, chills, cough, shortness of breath, and headache.  Past Medical, Surgical, Social History, Allergies, and Medications have been Reviewed.   Past Medical History:  Diagnosis Date  . High cholesterol   . High cholesterol    Past Surgical History:  Procedure Laterality Date  . TONSILLECTOMY      Social History   Socioeconomic History  . Marital status: Married    Spouse name: Marlowe Kays   . Number of children: Not on file  . Years of education: Not on file  . Highest education level: Associate degree: occupational, Hotel manager, or vocational program  Occupational History  . Not on file  Social Needs  . Financial resource strain: Not hard at all  . Food insecurity    Worry: Never true    Inability: Never true  . Transportation needs    Medical: No    Non-medical: No  Tobacco Use  . Smoking status: Never Smoker  . Smokeless tobacco: Never Used  Substance and Sexual Activity  . Alcohol use: No    Frequency: Never  . Drug use: No  . Sexual activity: Yes    Birth control/protection: None  Lifestyle  . Physical activity    Days per week: 0 days    Minutes per session: 0 min  . Stress: Rather much  Relationships  . Social Herbalist on phone: Three times a week    Gets together: Once a week    Attends religious service: More than 4 times per year    Active member of club or organization: No    Attends meetings of clubs or organizations: Never    Relationship status:  Married  . Intimate partner violence    Fear of current or ex partner: No    Emotionally abused: No    Physically abused: No    Forced sexual activity: No  Other Topics Concern  . Not on file  Social History Narrative   Lives with wife Marlowe Kays 13 years 7/7   No pets      Enjoys walking in the woods, love being outside      Diet: meats, veggies, fruits   Caffeine: 1 mountain dew (4 a week)   Water: 2 cups day      Wears seatbelt   Does not wear sunscreen a lot   Smoke detectors    Tries not to use phone while driving    Outpatient Encounter Medications as of 09/10/2018  Medication Sig  . busPIRone (BUSPAR) 5 MG tablet Take 1 tablet (5 mg total) by mouth 3 (three) times daily.  . diphenhydrAMINE (BENADRYL) 25 MG tablet Take 1 tablet (25 mg total) by mouth every 8 (eight) hours as  needed for itching.  Marland Kitchen EPINEPHrine 0.3 mg/0.3 mL IJ SOAJ injection Inject 0.3 mg into the skin as directed.  . hydrocortisone (ANUSOL-HC) 2.5 % rectal cream Apply rectally 2 times daily  . senna-docusate (SENOKOT-S) 8.6-50 MG tablet Take 1 tablet by mouth once as needed for mild constipation.  . sildenafil (REVATIO) 20 MG tablet Take 20-100 mg by mouth as directed.  . simvastatin (ZOCOR) 20 MG tablet Take 20 mg by mouth daily.   No facility-administered encounter medications on file as of 09/10/2018.    Allergies  Allergen Reactions  . Codeine Other (See Comments)    Other reaction(s): Other (See Comments) GI Upset  GI Upset  GI Upset      Review of Systems  Constitutional: Negative.   HENT: Negative.   Eyes: Negative.   Respiratory: Negative.   Cardiovascular: Negative.   Gastrointestinal: Negative.   Endocrine: Negative.   Genitourinary: Negative.   Musculoskeletal: Negative.   Skin: Negative.   Allergic/Immunologic: Negative.   Neurological: Negative.   Hematological: Negative.   Psychiatric/Behavioral: Positive for sleep disturbance. The patient is nervous/anxious.   All other systems reviewed and are negative.      Objective:     BP (!) 104/56   Pulse 76   Temp 98.1 F (36.7 C) (Oral)   Resp 12   Ht 5\' 10"  (1.778 m)   Wt 181 lb 0.6 oz (82.1 kg)   SpO2 95%   BMI 25.98 kg/m   Physical Exam Vitals signs and nursing note reviewed.  Constitutional:      Appearance: Normal appearance. He is well-developed, well-groomed and overweight.  HENT:     Head: Normocephalic and atraumatic.     Right Ear: External ear normal.     Left Ear: External ear normal.     Nose: Nose normal.     Mouth/Throat:     Mouth: Mucous membranes are moist.     Pharynx: Oropharynx is clear.  Eyes:     General:        Right eye: No discharge.        Left eye: No discharge.     Conjunctiva/sclera: Conjunctivae normal.  Neck:     Musculoskeletal: Normal range of motion and neck  supple.  Cardiovascular:     Rate and Rhythm: Normal rate and regular rhythm.     Pulses: Normal pulses.     Heart sounds: Normal heart sounds.  Pulmonary:  Effort: Pulmonary effort is normal.     Breath sounds: Normal breath sounds.  Musculoskeletal: Normal range of motion.  Skin:    General: Skin is warm.  Neurological:     General: No focal deficit present.     Mental Status: He is alert and oriented to person, place, and time.  Psychiatric:        Attention and Perception: Attention normal.        Mood and Affect: Affect is flat.        Speech: Speech normal.        Behavior: Behavior normal. Behavior is cooperative.        Thought Content: Thought content normal.        Cognition and Memory: Cognition normal.        Judgment: Judgment normal.      GAD 7 : Generalized Anxiety Score 09/10/2018  Nervous, Anxious, on Edge 0  Control/stop worrying 1  Worry too much - different things 1  Trouble relaxing 0  Restless 0  Easily annoyed or irritable 0  Afraid - awful might happen 1  Total GAD 7 Score 3  Anxiety Difficulty Not difficult at all     Depression screen [facility] 2/9 09/10/2018 08/06/2018  Decreased Interest 0 0  Down, Depressed, Hopeless 0 1  PHQ - 2 Score 0 1         Assessment and Plan       1. Situational anxiety Stable-but not much improved, continue medication, refill provided. 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  Reviewed side effects, risks and benefits of medication.   Patient acknowledged agreement and understanding of the plan.   - busPIRone (BUSPAR) 5 MG tablet; Take 1 tablet (5 mg total) by mouth 3 (three) times daily.  Dispense: 60 tablet; Refill: 1  2. Other male erectile dysfunction Reports not wanting the medication anymore. He is not in the mood for sexual intercourse And the pills no longer work. He declines a different medication. Medication  removed. Advised he can revisit this in the future. Therapy might help with this as well.  3. Sleep difficulties Sleep hygiene discussed. Advised him to get up when he is tossing and turning and try to write, read, walk around, or get a drink of water To help him relax and then try to go back to bed in 30 minutes. Discussed role of anxiety in this. Discussed his use of melatonin and buspar as well. Patient acknowledged agreement and understanding of the plan.   Follow Up: Nov or as needed  Perlie Mayo, DNP, AGNP-BC [address] [place], [place], [place] [address] Office Hours: Mon-Thurs 8 am-5 pm; Fri 8 am-12 pm Office Phone:  [phone]  Office Fax: [phone]

## 2018-09-10 NOTE — Patient Instructions (Signed)
    Thank you for coming into the office today. I appreciate the opportunity to provide you with the care for your health and wellness. Today we discussed: overall health  Follow Up: Nov 2020  No labs  Please call Glori Bickers back to get an appt.  Get out bed when wake up and try to read, walk, tv, get some water. And you try taking sleep aid if you wake up, if needed.  Please continue to practice social distancing to keep you, your family, and our community safe.  If you must go out, please wear a Mask and practice good handwashing.  [place] YOUR HANDS WELL AND FREQUENTLY. AVOID TOUCHING YOUR FACE, UNLESS YOUR HANDS ARE FRESHLY WASHED.  GET FRESH AIR DAILY. STAY HYDRATED WITH WATER.   It was a pleasure to see you and I look forward to continuing to work together on your health and well-being. Please do not hesitate to call the office if you need care or have questions about your care.  Have a wonderful day and week. With Gratitude, Cherly Beach, DNP, AGNP-BC

## 2018-10-28 ENCOUNTER — Telehealth: Payer: Self-pay | Admitting: Family Medicine

## 2018-10-28 NOTE — Telephone Encounter (Signed)
Pt would like a refill of the Viagra

## 2018-10-29 ENCOUNTER — Other Ambulatory Visit: Payer: Self-pay | Admitting: Family Medicine

## 2018-10-29 DIAGNOSIS — N528 Other male erectile dysfunction: Secondary | ICD-10-CM

## 2018-10-29 MED ORDER — SILDENAFIL CITRATE 20 MG PO TABS: 20.0000 mg | ORAL_TABLET | ORAL | 3 refills | Status: DC

## 2018-10-29 NOTE — Telephone Encounter (Signed)
Please refill the Revatio. He couldn't remember the name of it.

## 2018-10-29 NOTE — Telephone Encounter (Signed)
Made patient aware that medication has been sent in.

## 2018-11-05 ENCOUNTER — Telehealth: Payer: Self-pay | Admitting: *Deleted

## 2018-11-05 DIAGNOSIS — N528 Other male erectile dysfunction: Secondary | ICD-10-CM

## 2018-11-05 MED ORDER — SILDENAFIL CITRATE 20 MG PO TABS: 20.0000 mg | ORAL_TABLET | ORAL | 3 refills | Status: DC

## 2018-11-05 NOTE — Telephone Encounter (Signed)
Med resent to [facility]

## 2018-11-05 NOTE — Telephone Encounter (Signed)
Pt called wanting his sildenafil sent to [facility] in [place]

## 2018-11-10 ENCOUNTER — Telehealth: Payer: Self-pay | Admitting: *Deleted

## 2018-11-10 ENCOUNTER — Other Ambulatory Visit: Payer: Self-pay | Admitting: Family Medicine

## 2018-11-10 DIAGNOSIS — N528 Other male erectile dysfunction: Secondary | ICD-10-CM

## 2018-11-10 MED ORDER — SILDENAFIL CITRATE 20 MG PO TABS: 20.0000 mg | ORAL_TABLET | ORAL | 1 refills | Status: DC

## 2018-11-10 NOTE — Telephone Encounter (Signed)
Left generic message stating medication has been sent in for 90 day supply

## 2018-11-10 NOTE — Telephone Encounter (Signed)
Pt called about his sildenafil that was called into [place] [facility]. He only got 10 tablets. He ususally gets 7 and that last him all year. Wants to know what is going on with this prescription

## 2018-12-18 ENCOUNTER — Ambulatory Visit (INDEPENDENT_AMBULATORY_CARE_PROVIDER_SITE_OTHER): Payer: BC Managed Care – PPO | Admitting: Family Medicine

## 2018-12-18 ENCOUNTER — Other Ambulatory Visit: Payer: Self-pay

## 2018-12-18 ENCOUNTER — Encounter: Payer: Self-pay | Admitting: Family Medicine

## 2018-12-18 VITALS — BP 114/60 | HR 63 | Temp 98.5°F | Resp 15 | Ht 70.5 in | Wt 187.1 lb

## 2018-12-18 DIAGNOSIS — G479 Sleep disorder, unspecified: Secondary | ICD-10-CM

## 2018-12-18 DIAGNOSIS — F418 Other specified anxiety disorders: Secondary | ICD-10-CM

## 2018-12-18 DIAGNOSIS — N528 Other male erectile dysfunction: Secondary | ICD-10-CM | POA: Insufficient documentation

## 2018-12-18 DIAGNOSIS — R6889 Other general symptoms and signs: Secondary | ICD-10-CM | POA: Insufficient documentation

## 2018-12-18 NOTE — Progress Notes (Signed)
Subjective:     Patient ID: Russell Mckinney, male   DOB: 06-19-1950, 68 y.o.   MRN: [id number]  [name] presents for Anxiety (follow up  pt states it has gotten better) and Insomnia [name] is a 68 year old male patient of mine.  Has a history of high cholesterol, situational anxiety, erectile dysfunction among others.  Presents today for follow-up on anxiety reports that he is doing so much better now that he has been in retirement.  He has not used any BuSpar since going into retirement he had 10 weeks saved up from work he will go back in February and to do his full retirement by turning in his badge and uniforms.  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  But is not having anxiety related to that.  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  He would like to move out [place] towards [place] or he would settle for a 10 acre land area [place] but he knows that would be hard to come by.  Wife does not really want to move out [place] but he reports that he is the breadwinner so she would end up having to follow him.  Overall he is doing well and does not have any concerns today outside of possibly wanting to have his memory tested at his mother had a early onset of dementia at 61, he is now 65 and is noticed to having some forgetfulness including what he might be going into her room for.  Denies getting lost while driving denies forgetting reports his keys or parked his car.  Denies any vision changes any chest pain any confusion or dizziness.  Today patient denies signs and symptoms of COVID 19 infection including fever, chills, cough, shortness of breath, and headache.  Past Medical, Surgical, Social History, Allergies, and Medications have been Reviewed.   Past Medical History:  Diagnosis Date  . High cholesterol   . High cholesterol    Past Surgical  History:  Procedure Laterality Date  . TONSILLECTOMY     Social History   Socioeconomic History  . Marital status: Married    Spouse name: Russell Mckinney   . Number of children: Not on file  . Years of education: Not on file  . Highest education level: Associate degree: occupational, Hotel manager, or vocational program  Occupational History  . Not on file  Social Needs  . Financial resource strain: Not hard at all  . Food insecurity    Worry: Never true    Inability: Never true  . Transportation needs    Medical: No    Non-medical: No  Tobacco Use  . Smoking status: Never Smoker  . Smokeless tobacco: Never Used  Substance and Sexual Activity  . Alcohol use: No    Frequency: Never  . Drug use: No  . Sexual activity: Yes    Birth control/protection: None  Lifestyle  . Physical activity    Days per week: 0 days    Minutes per session: 0 min  . Stress: Rather much  Relationships  . Social Herbalist on phone: Three times a week    Gets together: Once a week    Attends religious service: More than 4 times per year    Active member of club or organization: No  Attends meetings of clubs or organizations: Never    Relationship status: Married  . Intimate partner violence    Fear of current or ex partner: No    Emotionally abused: No    Physically abused: No    Forced sexual activity: No  Other Topics Concern  . Not on file  Social History Narrative   Lives with wife Russell Mckinney 13 years 7/7   No pets      Enjoys walking in the woods, love being outside      Diet: meats, veggies, fruits   Caffeine: 1 mountain dew (4 a week)   Water: 2 cups day      Wears seatbelt   Does not wear sunscreen a lot   Smoke detectors    Tries not to use phone while driving    Outpatient Encounter Medications as of 12/18/2018  Medication Sig  . diphenhydrAMINE (BENADRYL) 25 MG tablet Take 1 tablet (25 mg total) by mouth every 8 (eight) hours as needed for itching.  Marland Kitchen EPINEPHrine 0.3  mg/0.3 mL IJ SOAJ injection Inject 0.3 mg into the skin as directed.  . hydrocortisone (ANUSOL-HC) 2.5 % rectal cream Apply rectally 2 times daily  . senna-docusate (SENOKOT-S) 8.6-50 MG tablet Take 1 tablet by mouth once as needed for mild constipation.  . sildenafil (REVATIO) 20 MG tablet Take 1-3 tablets (20-60 mg total) by mouth as directed. Take up to 4 hours before event.  . simvastatin (ZOCOR) 20 MG tablet Take 20 mg by mouth daily.  . [DISCONTINUED] busPIRone (BUSPAR) 5 MG tablet Take 1 tablet (5 mg total) by mouth 3 (three) times daily. (Patient not taking: Reported on 12/18/2018)   No facility-administered encounter medications on file as of 12/18/2018.    Allergies  Allergen Reactions  . Codeine Other (See Comments)    Other reaction(s): Other (See Comments) GI Upset  GI Upset  GI Upset      Review of Systems  Constitutional: Negative for chills and fever.  HENT: Negative.   Eyes: Negative.   Respiratory: Negative.   Cardiovascular: Negative.   Gastrointestinal: Negative.   Endocrine: Negative.   Genitourinary: Negative.   Musculoskeletal: Negative.   Skin: Negative.   Allergic/Immunologic: Negative.   Neurological: Negative.   Hematological: Negative.   Psychiatric/Behavioral: Negative.        Forgetfulness  All other systems reviewed and are negative.      Objective:     BP 114/60   Pulse 63   Temp 98.5 F (36.9 C) (Oral)   Resp 15   Ht 5' 10.5" (1.791 m)   Wt 187 lb 1.9 oz (84.9 kg)   SpO2 97%   BMI 26.47 kg/m   Physical Exam Vitals signs and nursing note reviewed.  Constitutional:      Appearance: Normal appearance. He is well-developed, well-groomed and overweight.  HENT:     Head: Normocephalic and atraumatic.     Right Ear: External ear normal.     Left Ear: External ear normal.     Nose: Nose normal.     Mouth/Throat:     Mouth: Mucous membranes are moist.     Pharynx: Oropharynx is clear.  Eyes:     General:        Right eye: No  discharge.        Left eye: No discharge.     Conjunctiva/sclera: Conjunctivae normal.  Neck:     Musculoskeletal: Normal range of motion and neck supple.  Cardiovascular:  Rate and Rhythm: Normal rate and regular rhythm.     Pulses: Normal pulses.     Heart sounds: Normal heart sounds.  Pulmonary:     Effort: Pulmonary effort is normal.     Breath sounds: Normal breath sounds.  Musculoskeletal: Normal range of motion.  Skin:    General: Skin is warm.  Neurological:     General: No focal deficit present.     Mental Status: He is alert and oriented to person, place, and time.  Psychiatric:        Attention and Perception: Attention and perception normal.        Mood and Affect: Mood and affect normal.        Speech: Speech normal.        Behavior: Behavior normal. Behavior is cooperative.        Thought Content: Thought content normal.        Cognition and Memory: Cognition and memory normal.        Judgment: Judgment normal.    MMSE - Mini Mental State Exam 12/18/2018  Orientation to time 5  Orientation to Place 5  Registration 3  Attention/ Calculation 5  Recall 3  Language- name 2 objects 2  Language- repeat 1  Language- follow 3 step command 3  Language- read & follow direction 1  Write a sentence 1  Copy design 1  Total score 30          Assessment and Plan       1. Situational anxiety Improved, no use of BuSpar in the last several weeks per him.  2. 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  Has had trouble with sleeping in and falling asleep.  Reports that overall he is doing okay and does not have any complaints and does not want to be on any medication for this.  Had at one point used melatonin to help with this but overall does not feel like he needs it at this time.  Encouraged to make sure that he follows sleep hygiene and provider routine for his brain.  3.  Forgetfulness Newer onset of forgetfulness.  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  Trouble forgetting sometimes what he came into the house or in his building for if it was a tool he would eventually remember within a few minutes but overall he has noticed an increase in this.  Denies having any trouble with finding keys or car.  Denies having any changes in his driving or getting lost.  Reports no confusion in general.   Follow-up: 6 months for annual, 1 year for MMSE recheck if needed  Perlie Mayo, DNP, AGNP-BC [place], [place], [place] [address] Office Hours: Mon-Thurs 8 am-5 pm; Fri 8 am-12 pm Office Phone:  [phone]  Office Fax: [phone]

## 2018-12-18 NOTE — Patient Instructions (Addendum)
  I appreciate the opportunity to provide you with care for your health and wellness. Today we discussed: memory, retirement and overall health  Follow up: 6 months annual with vision   No labs or referrals today  MMSE good today! We will recheck in 1 year.  Good luck on retirement and finding a part time job!  I hope you have a wonderful, happy, safe, and healthy Holiday Season! See you in the New Year :)  Please continue to practice social distancing to keep you, your family, and our community safe.  If you must go out, please wear a mask and practice good handwashing.  It was a pleasure to see you and I look forward to continuing to work together on your health and well-being. Please do not hesitate to call the office if you need care or have questions about your care.  Have a wonderful day and week. With Gratitude, Cherly Beach, DNP, AGNP-BC

## 2019-02-11 ENCOUNTER — Ambulatory Visit (INDEPENDENT_AMBULATORY_CARE_PROVIDER_SITE_OTHER): Payer: Self-pay | Admitting: Family Medicine

## 2019-02-11 ENCOUNTER — Other Ambulatory Visit: Payer: Self-pay

## 2019-02-11 ENCOUNTER — Encounter: Payer: Self-pay | Admitting: Family Medicine

## 2019-02-11 DIAGNOSIS — K5792 Diverticulitis of intestine, part unspecified, without perforation or abscess without bleeding: Secondary | ICD-10-CM | POA: Insufficient documentation

## 2019-02-11 MED ORDER — SACCHAROMYCES BOULARDII 250 MG PO CAPS: 250.0000 mg | ORAL_CAPSULE | Freq: Two times a day (BID) | ORAL | 0 refills | Status: AC

## 2019-02-11 MED ORDER — AMOXICILLIN-POT CLAVULANATE 875-125 MG PO TABS: 1.0000 | ORAL_TABLET | Freq: Three times a day (TID) | ORAL | 0 refills | Status: AC

## 2019-02-11 NOTE — Assessment & Plan Note (Signed)
Will provide course of Augmentin in addition to starting probiotics.  Advised to hydrate well.  Provided with signs and symptoms of acute abdomen and when to present to the emergency room should this medication not work.Patient acknowledged agreement and understanding of the plan.   Reviewed side effects, risks and benefits of medication.

## 2019-02-11 NOTE — Patient Instructions (Addendum)
Happy New Year! May you have a year filled with hope, love, happiness and laughter.  I appreciate the opportunity to provide you with care for your health and wellness. Today we discussed: Diverticulitis  Follow up: As already scheduled in May  No labs or referrals today  Please take the full antibiotic and start the probiotics to help with your gut health. Please remember to present to the emergency room if you develop signs and symptoms of infection that are beyond what you can be treated for today via phone visit these include fevers, chills, diarrhea, constipation, rigid tight abdomen with acute pain, nausea, vomiting.  Please continue to practice social distancing to keep you, your family, and our community safe.  If you must go out, please wear a mask and practice good handwashing.  It was a pleasure to see you and I look forward to continuing to work together on your health and well-being. Please do not hesitate to call the office if you need care or have questions about your care.  Have a wonderful day and week. With Gratitude, Cherly Beach, DNP, AGNP-BC

## 2019-02-11 NOTE — Progress Notes (Signed)
Virtual Visit via Telephone Note   This visit type was conducted due to national recommendations for restrictions regarding the COVID-19 Pandemic (e.g. social distancing) in an effort to limit this patient's exposure and mitigate transmission in our community.  Due to his co-morbid illnesses, this patient is at least at moderate risk for complications without adequate follow up.  This format is felt to be most appropriate for this patient at this time.  The patient did not have access to video technology/had technical difficulties with video requiring transitioning to audio format only (telephone).  All issues noted in this document were discussed and addressed.  No physical exam could be performed with this format.    Evaluation Performed:  Follow-up visit  Date:  02/11/2019   ID:  Russell Mckinney, DOB September 12, 1950, MRN [id number]  Patient Location: Home Provider Location: Office  Location of Patient: Home Location of Provider: Telehealth Consent was obtain for visit to be over via telehealth. I verified that I am speaking with the correct person using two identifiers.  PCP:  Perlie Mayo, NP   Chief Complaint:  Ab pain left side   History of Present Illness:    Russell Mckinney is a 69 y.o. male with history of high cholesterol, diverticulitis, anxiety among others.  Reports today that he has developed lower left-sided abdominal pain that is consistent with pain that he felt for years ago when diagnosed with diverticulitis.  He reports that he was put on antibiotic at that time and that relieved the discomfort.  He reports the pain is the pretty much exact same as it was.  But he does have a little bit of pain up under the rib cage as well.  Denies having any changes in his food habits.  Denies having any diarrhea, constipation, nausea, vomiting.  Denies having any fevers or chills.  Denies having any blood in stool.  Predominantly just abdominal pain which is how it presented lasts time and does  not want it to get worse.  The patient does not have symptoms concerning for COVID-19 infection (fever, chills, cough, or new shortness of breath).   Past Medical, Surgical, Social History, Allergies, and Medications have been Reviewed.  Past Medical History:  Diagnosis Date  . Diverticulitis    2017  . High cholesterol   . High cholesterol    Past Surgical History:  Procedure Laterality Date  . TONSILLECTOMY       Current Meds  Medication Sig  . EPINEPHrine 0.3 mg/0.3 mL IJ SOAJ injection Inject 0.3 mg into the skin as directed.  . sildenafil (REVATIO) 20 MG tablet Take 1-3 tablets (20-60 mg total) by mouth as directed. Take up to 4 hours before event.     Allergies:   Codeine   ROS:   Please see the history of present illness.    All other systems reviewed and are negative.   Labs/Other Tests and Data Reviewed:    Recent Labs: No results found for requested labs within last 8760 hours.   Recent Lipid Panel No results found for: CHOL, TRIG, HDL, CHOLHDL, LDLCALC, LDLDIRECT  Wt Readings from Last 3 Encounters:  12/18/18 187 lb 1.9 oz (84.9 kg)  09/10/18 181 lb 0.6 oz (82.1 kg)  08/06/18 180 lb 0.6 oz (81.7 kg)     Objective:    Vital Signs:  There were no vitals taken for this visit.   VITAL SIGNS:  reviewed GEN:  Alert and oriented RESPIRATORY:  No shortness of  breath noted in conversation PSYCH:  Normal affect and mood  ASSESSMENT & PLAN:    Please see problem list for assessment and plan. 1. Diverticulitis   - amoxicillin-clavulanate (AUGMENTIN) 875-125 MG tablet; Take 1 tablet by mouth every 8 (eight) hours for 5 days.  Dispense: 15 tablet; Refill: 0 - saccharomyces boulardii (FLORASTOR) 250 MG capsule; Take 1 capsule (250 mg total) by mouth 2 (two) times daily for 15 days.  Dispense: 30 capsule; Refill: 0   Time:   Today, I have spent 10 minutes with the patient with telehealth technology discussing the above problems.     Medication  Adjustments/Labs and Tests Ordered: Current medicines are reviewed at length with the patient today.  Concerns regarding medicines are outlined above.   Tests Ordered: No orders of the defined types were placed in this encounter.   Medication Changes: Meds ordered this encounter  Medications  . amoxicillin-clavulanate (AUGMENTIN) 875-125 MG tablet    Sig: Take 1 tablet by mouth every 8 (eight) hours for 5 days.    Dispense:  15 tablet    Refill:  0    Order Specific Question:   Supervising Provider    Answer:   SIMPSON, MARGARET E [id number]  . saccharomyces boulardii (FLORASTOR) 250 MG capsule    Sig: Take 1 capsule (250 mg total) by mouth 2 (two) times daily for 15 days.    Dispense:  30 capsule    Refill:  0    Order Specific Question:   Supervising Provider    Answer:   Fayrene Helper [id number]    Disposition:  Follow up 06/17/2019  Signed, Perlie Mayo, NP  02/11/2019 11:58 AM     [facility]

## 2019-06-17 ENCOUNTER — Encounter: Payer: BC Managed Care – PPO | Admitting: Family Medicine

## 2019-09-06 ENCOUNTER — Telehealth: Payer: Self-pay

## 2019-09-06 NOTE — Telephone Encounter (Signed)
Patient scheduled to come in and see provider since he has not had this medication in 3 years and NP wasn't the one that originally prescribed the med

## 2019-09-06 NOTE — Telephone Encounter (Signed)
Pt would like a refill on his diverticulitis medication

## 2019-09-08 ENCOUNTER — Telehealth: Payer: Self-pay | Admitting: Family Medicine

## 2020-01-25 ENCOUNTER — Other Ambulatory Visit: Payer: Self-pay

## 2020-01-25 DIAGNOSIS — Z20822 Contact with and (suspected) exposure to covid-19: Secondary | ICD-10-CM

## 2020-01-27 LAB — NOVEL CORONAVIRUS, NAA: SARS-CoV-2, NAA: NOT DETECTED

## 2020-01-27 LAB — SARS-COV-2, NAA 2 DAY TAT

## 2020-06-14 ENCOUNTER — Ambulatory Visit (INDEPENDENT_AMBULATORY_CARE_PROVIDER_SITE_OTHER): Payer: Medicare Other | Admitting: Nurse Practitioner

## 2020-06-14 ENCOUNTER — Other Ambulatory Visit: Payer: Self-pay

## 2020-06-14 ENCOUNTER — Encounter: Payer: Self-pay | Admitting: Nurse Practitioner

## 2020-06-14 VITALS — BP 127/71 | HR 59 | Temp 98.6°F | Resp 20 | Ht 70.5 in | Wt 178.0 lb

## 2020-06-14 DIAGNOSIS — Z139 Encounter for screening, unspecified: Secondary | ICD-10-CM | POA: Insufficient documentation

## 2020-06-14 DIAGNOSIS — Z87892 Personal history of anaphylaxis: Secondary | ICD-10-CM | POA: Diagnosis not present

## 2020-06-14 DIAGNOSIS — Z23 Encounter for immunization: Secondary | ICD-10-CM | POA: Diagnosis not present

## 2020-06-14 DIAGNOSIS — Z0000 Encounter for general adult medical examination without abnormal findings: Secondary | ICD-10-CM | POA: Diagnosis not present

## 2020-06-14 DIAGNOSIS — Z1211 Encounter for screening for malignant neoplasm of colon: Secondary | ICD-10-CM | POA: Diagnosis not present

## 2020-06-14 DIAGNOSIS — T7840XD Allergy, unspecified, subsequent encounter: Secondary | ICD-10-CM

## 2020-06-14 DIAGNOSIS — Z0001 Encounter for general adult medical examination with abnormal findings: Secondary | ICD-10-CM | POA: Diagnosis not present

## 2020-06-14 MED ORDER — EPINEPHRINE 0.3 MG/0.3ML IJ SOAJ: 0.3000 mg | INTRAMUSCULAR | 1 refills | Status: DC

## 2020-06-14 NOTE — Addendum Note (Signed)
Addended by: Lonn Georgia on: 06/14/2020 04:45 PM   Modules accepted: Orders

## 2020-06-14 NOTE — Assessment & Plan Note (Signed)
-  physical exam performed today 

## 2020-06-14 NOTE — Progress Notes (Signed)
Established Patient Office Visit  Subjective:  Patient ID: Russell Mckinney, male    DOB: 22-Dec-1950  Age: 70 y.o. MRN: [id number]  CC:  Chief Complaint  Patient presents with  . Annual Exam    HPI Russell Mckinney presents for physical exam.  His wife passed away in late Apr 19, 2019 d/t COVID.  No acute concerns today.  Past Medical History:  Diagnosis Date  . Diverticulitis    2015-04-19  . High cholesterol   . High cholesterol     Past Surgical History:  Procedure Laterality Date  . TONSILLECTOMY      Family History  Problem Relation Age of Onset  . Diabetes Brother     Social History   Socioeconomic History  . Marital status: Married    Spouse name: Russell Mckinney   . Number of children: Not on file  . Years of education: Not on file  . Highest education level: Associate degree: occupational, Hotel manager, or vocational program  Occupational History  . Not on file  Tobacco Use  . Smoking status: Never Smoker  . Smokeless tobacco: Never Used  Substance and Sexual Activity  . Alcohol use: No  . Drug use: No  . Sexual activity: Yes    Birth control/protection: None  Other Topics Concern  . Not on file  Social History Narrative   Lives with wife Russell Mckinney 13 years 7/7   No pets      Enjoys walking in the woods, love being outside      Diet: meats, veggies, fruits   Caffeine: 1 mountain dew (4 a week)   Water: 2 cups day      Wears seatbelt   Does not wear sunscreen a lot   Smoke detectors    Tries not to use phone while driving   Social Determinants of Radio broadcast assistant Strain: Not on file  Food Insecurity: Not on file  Transportation Needs: Not on file  Physical Activity: Not on file  Stress: Not on file  Social Connections: Not on file  Intimate Partner Violence: Not on file    Outpatient Medications Prior to Visit  Medication Sig Dispense Refill  . sildenafil (REVATIO) 20 MG tablet Take 1-3 tablets (20-60 mg total) by mouth as directed. Take up to 4 hours  before event. 90 tablet 1  . EPINEPHrine 0.3 mg/0.3 mL IJ SOAJ injection Inject 0.3 mg into the skin as directed.     No facility-administered medications prior to visit.    Allergies  Allergen Reactions  . Codeine Other (See Comments)    Other reaction(s): Other (See Comments) GI Upset  GI Upset  GI Upset      ROS Review of Systems  Constitutional: Negative.   HENT: Negative.   Eyes: Negative.   Respiratory: Negative.   Gastrointestinal: Negative.   Endocrine: Negative.   Genitourinary: Negative.   Musculoskeletal: Negative.   Skin: Negative.   Allergic/Immunologic: Negative.   Neurological: Negative.   Hematological: Negative.   Psychiatric/Behavioral: Negative.       Objective:    Physical Exam Constitutional:      Appearance: Normal appearance.  HENT:     Head: Normocephalic and atraumatic.     Right Ear: Tympanic membrane, ear canal and external ear normal.     Left Ear: Tympanic membrane, ear canal and external ear normal.     Nose: Nose normal.     Mouth/Throat:     Mouth: Mucous membranes are moist.     Pharynx: Oropharynx  is clear.  Eyes:     Extraocular Movements: Extraocular movements intact.     Conjunctiva/sclera: Conjunctivae normal.     Pupils: Pupils are equal, round, and reactive to light.  Cardiovascular:     Rate and Rhythm: Normal rate and regular rhythm.     Pulses: Normal pulses.     Heart sounds: Normal heart sounds.  Pulmonary:     Effort: Pulmonary effort is normal.     Breath sounds: Normal breath sounds.  Abdominal:     General: Abdomen is flat. Bowel sounds are normal.     Palpations: Abdomen is soft.  Musculoskeletal:        General: Normal range of motion.     Cervical back: Normal range of motion and neck supple.  Skin:    General: Skin is warm and dry.     Capillary Refill: Capillary refill takes less than 2 seconds.  Neurological:     General: No focal deficit present.     Mental Status: He is alert and oriented to  person, place, and time.     Cranial Nerves: No cranial nerve deficit.     Sensory: No sensory deficit.     Motor: No weakness.     Coordination: Coordination normal.     Gait: Gait normal.  Psychiatric:        Mood and Affect: Mood normal.        Behavior: Behavior normal.        Thought Content: Thought content normal.        Judgment: Judgment normal.     BP 127/71   Pulse (!) 59   Temp 98.6 F (37 C)   Resp 20   Ht 5' 10.5" (1.791 m)   Wt 178 lb (80.7 kg)   SpO2 96%   BMI 25.18 kg/m  Wt Readings from Last 3 Encounters:  06/14/20 178 lb (80.7 kg)  12/18/18 187 lb 1.9 oz (84.9 kg)  09/10/18 181 lb 0.6 oz (82.1 kg)     Health Maintenance Due  Topic Date Due  . Hepatitis C Screening  Never done  . COLONOSCOPY (Pts 45-3yrs Insurance coverage will need to be confirmed)  Never done  . PNA vac Low Risk Adult (2 of 2 - PPSV23) 12/06/2018  . COVID-19 Vaccine (3 - Booster for Moderna series) 05/01/2020    There are no preventive care reminders to display for this patient.  No results found for: TSH Lab Results  Component Value Date   WBC 12.8 (H) 07/28/2015   HGB 14.5 07/28/2015   HCT 42.4 07/28/2015   MCV 87.8 07/28/2015   PLT 188 07/28/2015   Lab Results  Component Value Date   NA 137 07/28/2015   K 3.8 07/28/2015   CO2 28 07/28/2015   GLUCOSE 99 07/28/2015   BUN 10 07/28/2015   CREATININE 1.06 07/28/2015   CALCIUM 9.0 07/28/2015   ANIONGAP 8 07/28/2015   No results found for: CHOL No results found for: HDL No results found for: LDLCALC No results found for: TRIG No results found for: CHOLHDL No results found for: HGBA1C    Assessment & Plan:   Problem List Items Addressed This Visit      Other   Encounter for well adult exam with abnormal findings    -physical exam performed today      History of anaphylaxis    -unsure of allergen that caused anaphylaxis -refilled epipen      Immunization due    -Pneumovax-23  administered today       Colon cancer screening    -obtain records -states he had colonoscopy about 4 years ago in [place]      Screening due    -HCV with this set of labs      Relevant Orders   Hepatitis C antibody    Other Visit Diagnoses    Preventative health care    -  Primary   Relevant Orders   CBC with Differential/Platelet   CMP14+EGFR   Lipid Panel With LDL/HDL Ratio   Allergy, subsequent encounter       Relevant Medications   EPINEPHrine 0.3 mg/0.3 mL IJ SOAJ injection      Meds ordered this encounter  Medications  . EPINEPHrine 0.3 mg/0.3 mL IJ SOAJ injection    Sig: Inject 0.3 mg into the skin as directed.    Dispense:  2 each    Refill:  1    Follow-up: Return in about 1 year (around 06/14/2021) for Physical Exam (same day fasting labs).    Noreene Larsson, NP

## 2020-06-14 NOTE — Assessment & Plan Note (Signed)
-  Pneumovax-23 administered today

## 2020-06-14 NOTE — Assessment & Plan Note (Signed)
-  unsure of allergen that caused anaphylaxis -refilled epipen

## 2020-06-14 NOTE — Assessment & Plan Note (Signed)
-  HCV with this set of labs

## 2020-06-14 NOTE — Assessment & Plan Note (Signed)
-  obtain records -states he had colonoscopy about 4 years ago in [place]

## 2020-06-23 ENCOUNTER — Encounter: Payer: Medicare Other | Admitting: Nurse Practitioner

## 2020-07-10 ENCOUNTER — Encounter: Payer: Medicare Other | Admitting: Nurse Practitioner

## 2020-07-26 ENCOUNTER — Telehealth: Payer: Self-pay

## 2020-07-26 NOTE — Telephone Encounter (Signed)
Pt would like to know how you dx being impotent or sterile

## 2020-07-27 NOTE — Telephone Encounter (Signed)
Appt made

## 2020-07-31 ENCOUNTER — Encounter: Payer: Medicare Other | Admitting: Nurse Practitioner

## 2020-08-03 ENCOUNTER — Encounter: Payer: Self-pay | Admitting: Nurse Practitioner

## 2020-08-03 ENCOUNTER — Ambulatory Visit (INDEPENDENT_AMBULATORY_CARE_PROVIDER_SITE_OTHER): Payer: Medicare Other | Admitting: Nurse Practitioner

## 2020-08-03 ENCOUNTER — Other Ambulatory Visit: Payer: Self-pay

## 2020-08-03 VITALS — BP 130/81 | HR 52 | Temp 97.5°F | Ht 70.5 in | Wt 178.0 lb

## 2020-08-03 DIAGNOSIS — N528 Other male erectile dysfunction: Secondary | ICD-10-CM | POA: Diagnosis not present

## 2020-08-03 DIAGNOSIS — F5232 Male orgasmic disorder: Secondary | ICD-10-CM | POA: Insufficient documentation

## 2020-08-03 HISTORY — DX: Male orgasmic disorder: F52.32

## 2020-08-03 NOTE — Progress Notes (Signed)
Acute Office Visit  Subjective:    Patient ID: Russell Mckinney, male    DOB: [birth year], 70 y.o.   MRN: [id number]  Chief Complaint  Patient presents with   Personal Problem    Wants referral to urology    HPI Patient is in today for erectile dysfunction.    Past Medical History:  Diagnosis Date   Diverticulitis    2017   High cholesterol    High cholesterol     Past Surgical History:  Procedure Laterality Date   TONSILLECTOMY      Family History  Problem Relation Age of Onset   Diabetes Brother     Social History   Socioeconomic History   Marital status: Married    Spouse name: Marlowe Kays    Number of children: Not on file   Years of education: Not on file   Highest education level: Associate degree: occupational, Hotel manager, or vocational program  Occupational History   Not on file  Tobacco Use   Smoking status: Never   Smokeless tobacco: Never  Substance and Sexual Activity   Alcohol use: No   Drug use: No   Sexual activity: Yes    Birth control/protection: None  Other Topics Concern   Not on file  Social History Narrative   Lives with wife Marlowe Kays 13 years 7/7   No pets      Enjoys walking in the woods, love being outside      Diet: meats, veggies, fruits   Caffeine: 1 mountain dew (4 a week)   Water: 2 cups day      Wears seatbelt   Does not wear sunscreen a lot   Smoke detectors    Tries not to use phone while driving   Social Determinants of Health   Financial Resource Strain: Not on file  Food Insecurity: Not on file  Transportation Needs: Not on file  Physical Activity: Not on file  Stress: Not on file  Social Connections: Not on file  Intimate Partner Violence: Not on file    Outpatient Medications Prior to Visit  Medication Sig Dispense Refill   EPINEPHrine 0.3 mg/0.3 mL IJ SOAJ injection Inject 0.3 mg into the skin as directed. 2 each 1   sildenafil (REVATIO) 20 MG tablet Take 1-3 tablets (20-60 mg total) by mouth as directed.  Take up to 4 hours before event. 90 tablet 1   No facility-administered medications prior to visit.    Allergies  Allergen Reactions   Codeine Other (See Comments)    Other reaction(s): Other (See Comments) GI Upset  GI Upset  GI Upset      Review of Systems  Constitutional: Negative.   Respiratory: Negative.    Cardiovascular: Negative.   Genitourinary:        Erectile dysfunction and anorgasmia      Objective:    Physical Exam Constitutional:      Appearance: Normal appearance.  Neurological:     General: No focal deficit present.     Mental Status: He is alert and oriented to person, place, and time.  Psychiatric:        Mood and Affect: Mood normal.        Behavior: Behavior normal.        Thought Content: Thought content normal.        Judgment: Judgment normal.    BP 130/81 (BP Location: Right Arm, Patient Position: Sitting, Cuff Size: Large)   Pulse (!) 52   Temp (!) 97.5 F (  36.4 C) (Temporal)   Ht 5' 10.5" (1.791 m)   Wt 178 lb (80.7 kg)   SpO2 95%   BMI 25.18 kg/m  Wt Readings from Last 3 Encounters:  08/03/20 178 lb (80.7 kg)  06/14/20 178 lb (80.7 kg)  12/18/18 187 lb 1.9 oz (84.9 kg)    Health Maintenance Due  Topic Date Due   Hepatitis C Screening  Never done   COLONOSCOPY (Pts 45-75yrs Insurance coverage will need to be confirmed)  Never done    There are no preventive care reminders to display for this patient.   No results found for: TSH Lab Results  Component Value Date   WBC 12.8 (H) 07/28/2015   HGB 14.5 07/28/2015   HCT 42.4 07/28/2015   MCV 87.8 07/28/2015   PLT 188 07/28/2015   Lab Results  Component Value Date   NA 137 07/28/2015   K 3.8 07/28/2015   CO2 28 07/28/2015   GLUCOSE 99 07/28/2015   BUN 10 07/28/2015   CREATININE 1.06 07/28/2015   CALCIUM 9.0 07/28/2015   ANIONGAP 8 07/28/2015   No results found for: CHOL No results found for: HDL No results found for: LDLCALC No results found for: TRIG No results  found for: CHOLHDL No results found for: HGBA1C     Assessment & Plan:   Problem List Items Addressed This Visit       Other   Other male erectile dysfunction    -used sildenafil, but states that didn't help much -referral to urology       Relevant Orders   Ambulatory referral to Urology   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    Noreene Larsson, NP

## 2020-08-03 NOTE — Assessment & Plan Note (Signed)
-  used sildenafil, but states that didn't help much -referral to urology

## 2020-08-03 NOTE — Assessment & Plan Note (Signed)
-  sometimes he has erectile dysfunction, but even when he has an erection he has difficulty reaching orgasm -referral to urology

## 2020-09-13 ENCOUNTER — Other Ambulatory Visit: Payer: Self-pay

## 2020-09-13 ENCOUNTER — Encounter: Payer: Self-pay | Admitting: Internal Medicine

## 2020-09-13 ENCOUNTER — Ambulatory Visit (INDEPENDENT_AMBULATORY_CARE_PROVIDER_SITE_OTHER): Payer: Medicare Other | Admitting: Internal Medicine

## 2020-09-13 VITALS — BP 120/71 | HR 60 | Temp 97.8°F | Resp 18 | Ht 70.0 in | Wt 178.0 lb

## 2020-09-13 DIAGNOSIS — Z77120 Contact with and (suspected) exposure to mold (toxic): Secondary | ICD-10-CM

## 2020-09-13 NOTE — Patient Instructions (Signed)
Please continue to take medications as prescribed.  If you have black crusting, pain around eyes and nose or vision difficulty, please contact us or get medical attention immediately.

## 2020-09-13 NOTE — Progress Notes (Signed)
Acute Office Visit  Subjective:    Patient ID: Russell Mckinney, male    DOB: 29-Nov-1950, 70 y.o.   MRN: [id number]  Chief Complaint  Patient presents with   Acute Visit    Pt was exposed to black mold started coughing and noticing blood in phlegm no fever and body aches this started 09-06-20 pt is fine today but wants to know what next steps are     HPI Patient is in today for evaluation after mold exposure at home. He has covered the wall area. He had an episode of blood tinged sputum few days ago and got concerned about mold infection. No h/o DM. Not on immunosuppresive therapy. Denies any fever, chills, cough, black crusting, pain around eyes or nostrils.  Past Medical History:  Diagnosis Date   Diverticulitis    2017   High cholesterol    High cholesterol     Past Surgical History:  Procedure Laterality Date   TONSILLECTOMY      Family History  Problem Relation Age of Onset   Diabetes Brother     Social History   Socioeconomic History   Marital status: Married    Spouse name: Marlowe Kays    Number of children: Not on file   Years of education: Not on file   Highest education level: Associate degree: occupational, Hotel manager, or vocational program  Occupational History   Not on file  Tobacco Use   Smoking status: Never   Smokeless tobacco: Never  Substance and Sexual Activity   Alcohol use: No   Drug use: No   Sexual activity: Yes    Birth control/protection: None  Other Topics Concern   Not on file  Social History Narrative   Lives with wife Marlowe Kays 13 years 7/7   No pets      Enjoys walking in the woods, love being outside      Diet: meats, veggies, fruits   Caffeine: 1 mountain dew (4 a week)   Water: 2 cups day      Wears seatbelt   Does not wear sunscreen a lot   Smoke detectors    Tries not to use phone while driving   Social Determinants of Health   Financial Resource Strain: Not on file  Food Insecurity: Not on file  Transportation Needs: Not on  file  Physical Activity: Not on file  Stress: Not on file  Social Connections: Not on file  Intimate Partner Violence: Not on file    Outpatient Medications Prior to Visit  Medication Sig Dispense Refill   EPINEPHrine 0.3 mg/0.3 mL IJ SOAJ injection Inject 0.3 mg into the skin as directed. 2 each 1   sildenafil (REVATIO) 20 MG tablet Take 1-3 tablets (20-60 mg total) by mouth as directed. Take up to 4 hours before event. 90 tablet 1   No facility-administered medications prior to visit.    Allergies  Allergen Reactions   Codeine Other (See Comments)    Other reaction(s): Other (See Comments) GI Upset  GI Upset  GI Upset      Review of Systems  Constitutional:  Negative for chills, fatigue and fever.  HENT:  Negative for congestion, sinus pressure and sinus pain.   Respiratory:  Negative for cough and shortness of breath.   Neurological:  Negative for dizziness and weakness.      Objective:    Physical Exam Constitutional:      General: He is not in acute distress.    Appearance: He is not  diaphoretic.  HENT:     Head: Normocephalic and atraumatic.     Nose: No congestion.     Mouth/Throat:     Mouth: Mucous membranes are moist.     Pharynx: No posterior oropharyngeal erythema.  Eyes:     General: No scleral icterus.    Extraocular Movements: Extraocular movements intact.  Cardiovascular:     Rate and Rhythm: Normal rate and regular rhythm.     Pulses: Normal pulses.     Heart sounds: Normal heart sounds. No murmur heard. Pulmonary:     Breath sounds: Normal breath sounds. No wheezing or rales.  Skin:    General: Skin is warm.     Findings: No rash.  Neurological:     General: No focal deficit present.     Mental Status: He is alert and oriented to person, place, and time.  Psychiatric:        Mood and Affect: Mood normal.        Behavior: Behavior normal.    BP 120/71 (BP Location: Left Arm, Patient Position: Sitting, Cuff Size: Normal)   Pulse 60    Temp 97.8 F (36.6 C) (Oral)   Resp 18   Ht '5\' 10"'$  (1.778 m)   Wt 178 lb (80.7 kg)   SpO2 96%   BMI 25.54 kg/m  Wt Readings from Last 3 Encounters:  09/13/20 178 lb (80.7 kg)  08/03/20 178 lb (80.7 kg)  06/14/20 178 lb (80.7 kg)    Health Maintenance Due  Topic Date Due   Hepatitis C Screening  Never done   COLONOSCOPY (Pts 45-74yr Insurance coverage will need to be confirmed)  Never done   COVID-19 Vaccine (3 - Moderna risk series) 11/30/2019   INFLUENZA VACCINE  09/04/2020    There are no preventive care reminders to display for this patient.   No results found for: TSH Lab Results  Component Value Date   WBC 12.8 (H) 07/28/2015   HGB 14.5 07/28/2015   HCT 42.4 07/28/2015   MCV 87.8 07/28/2015   PLT 188 07/28/2015   Lab Results  Component Value Date   NA 137 07/28/2015   K 3.8 07/28/2015   CO2 28 07/28/2015   GLUCOSE 99 07/28/2015   BUN 10 07/28/2015   CREATININE 1.06 07/28/2015   CALCIUM 9.0 07/28/2015   ANIONGAP 8 07/28/2015   No results found for: CHOL No results found for: HDL No results found for: LDLCALC No results found for: TRIG No results found for: CHOLHDL No results found for: HGBA1C     Assessment & Plan:   Problem List Items Addressed This Visit    Visit Diagnoses     Mold exposure    -  Primary No systemic symptoms currently Not at risk for invasive fungal infection Reassured about benign nature of isolated episode of blood tinged sputum Needs mold treatment at home Advised about warning symptoms/signs of invasive fungal infection        No orders of the defined types were placed in this encounter.    RLindell Spar MD

## 2020-09-29 ENCOUNTER — Ambulatory Visit (INDEPENDENT_AMBULATORY_CARE_PROVIDER_SITE_OTHER): Payer: Medicare Other

## 2020-09-29 ENCOUNTER — Other Ambulatory Visit: Payer: Self-pay

## 2020-09-29 DIAGNOSIS — Z1211 Encounter for screening for malignant neoplasm of colon: Secondary | ICD-10-CM

## 2020-09-29 DIAGNOSIS — Z0000 Encounter for general adult medical examination without abnormal findings: Secondary | ICD-10-CM

## 2020-09-29 DIAGNOSIS — Z0100 Encounter for examination of eyes and vision without abnormal findings: Secondary | ICD-10-CM

## 2020-09-29 NOTE — Progress Notes (Signed)
Subjective:   Russell Mckinney is a 70 y.o. male who presents for Medicare Annual/Subsequent preventive examination.         Objective:    There were no vitals filed for this visit. There is no height or weight on file to calculate BMI.  Advanced Directives 12/14/2017 07/28/2015  Does Patient Have a Medical Advance Directive? No No    Current Medications (verified) Outpatient Encounter Medications as of 09/29/2020  Medication Sig   EPINEPHrine 0.3 mg/0.3 mL IJ SOAJ injection Inject 0.3 mg into the skin as directed.   sildenafil (REVATIO) 20 MG tablet Take 1-3 tablets (20-60 mg total) by mouth as directed. Take up to 4 hours before event.   No facility-administered encounter medications on file as of 09/29/2020.    Allergies (verified) Codeine   History: Past Medical History:  Diagnosis Date   Diverticulitis    2017   High cholesterol    High cholesterol    Past Surgical History:  Procedure Laterality Date   TONSILLECTOMY     Family History  Problem Relation Age of Onset   Diabetes Brother    Social History   Socioeconomic History   Marital status: Married    Spouse name: Marlowe Kays    Number of children: Not on file   Years of education: Not on file   Highest education level: Associate degree: occupational, Hotel manager, or vocational program  Occupational History   Not on file  Tobacco Use   Smoking status: Never   Smokeless tobacco: Never  Substance and Sexual Activity   Alcohol use: No   Drug use: No   Sexual activity: Yes    Birth control/protection: None  Other Topics Concern   Not on file  Social History Narrative   Lives with wife Marlowe Kays 13 years 7/7   No pets      Enjoys walking in the woods, love being outside      Diet: meats, veggies, fruits   Caffeine: 1 mountain dew (4 a week)   Water: 2 cups day      Wears seatbelt   Does not wear sunscreen a lot   Smoke detectors    Tries not to use phone while driving   Social Determinants of Health    Financial Resource Strain: Not on file  Food Insecurity: Not on file  Transportation Needs: Not on file  Physical Activity: Not on file  Stress: Not on file  Social Connections: Not on file    Tobacco Counseling Counseling given: Not Answered                  Diabetic? No          Activities of Daily Living In your present state of health, do you have any difficulty performing the following activities: 09/13/2020  Hearing? N  Vision? N  Difficulty concentrating or making decisions? N  Walking or climbing stairs? N  Dressing or bathing? N  Doing errands, shopping? N  Some recent data might be hidden    Patient Care Team: Noreene Larsson, NP as PCP - General (Nurse Practitioner)  Indicate any recent Medical Services you may have received from other than [facility] providers in the past year (date may be approximate).     Assessment:   This is a routine wellness examination for Russell Mckinney.  Hearing/Vision screen No results found.  Dietary issues and exercise activities discussed:     Goals Addressed   None   Depression Screen PHQ 2/9 Scores 09/13/2020  08/03/2020 06/14/2020 02/11/2019 12/18/2018 09/10/2018 08/06/2018  PHQ - 2 Score _0 0 0 0 1  PHQ- 9 Score - 1 3 - - - -    Fall Risk Fall Risk  09/13/2020 08/03/2020 06/14/2020 02/11/2019 12/18/2018  Falls in the past year? 0 0 0 0 0  Number falls in past yr: 0 0 0 0 0  Injury with Fall? 0 0 0 0 0  Risk for fall due to : No Fall Risks No Fall Risks No Fall Risks - -  Follow up Falls evaluation completed Falls evaluation completed Falls evaluation completed - -    FALL RISK PREVENTION PERTAINING TO THE HOME:  Any stairs in or around the home? No  If so, are there any without handrails? No  Home free of loose throw rugs in walkways, pet beds, electrical cords, etc? Yes  Adequate lighting in your home to reduce risk of falls? Yes   ASSISTIVE DEVICES UTILIZED TO PREVENT FALLS:  Life alert? No  Use of a cane, walker  or w/c? No  Grab bars in the bathroom? No  Shower chair or bench in shower? No  Elevated toilet seat or a handicapped toilet? No   TIMED UP AND GO:  Was the test performed? No .    Cognitive Function: MMSE - Mini Mental State Exam 12/18/2018  Orientation to time 5  Orientation to Place 5  Registration 3  Attention/ Calculation 5  Recall 3  Language- name 2 objects 2  Language- repeat 1  Language- follow 3 step command 3  Language- read & follow direction 1  Write a sentence 1  Copy design 1  Total score 30        Immunizations Immunization History  Administered Date(s) Administered   Moderna Sars-Covid-2 Vaccination 10/07/2019, 11/02/2019   Pneumococcal Conjugate-13 12/05/2017   Pneumococcal Polysaccharide-23 06/14/2020   Tdap 11/17/2015    TDAP status: Up to date  Flu Vaccine status: Due, Education has been provided regarding the importance of this vaccine. Advised may receive this vaccine at local pharmacy or Health Dept. Aware to provide a copy of the vaccination record if obtained from local pharmacy or Health Dept. Verbalized acceptance and understanding.  Pneumococcal vaccine status: Completed during today's visit.  Covid-19 vaccine status: Completed vaccines  Qualifies for Shingles Vaccine? Yes   Zostavax completed No   Shingrix Completed?: No.    Education has been provided regarding the importance of this vaccine. Patient has been advised to call insurance company to determine out of pocket expense if they have not yet received this vaccine. Advised may also receive vaccine at local pharmacy or Health Dept. Verbalized acceptance and understanding.  Screening Tests Health Maintenance  Topic Date Due   Hepatitis C Screening  Never done   COLONOSCOPY (Pts 45-89yr Insurance coverage will need to be confirmed)  Never done   COVID-19 Vaccine (3 - Moderna risk series) 11/30/2019   INFLUENZA VACCINE  09/04/2020   Zoster Vaccines- Shingrix (1 of 2) 11/03/2020  (Originally 11/17/1969)   TETANUS/TDAP  11/16/2025   PNA vac Low Risk Adult  Completed   HPV VACCINES  Aged Out    Health Maintenance  Health Maintenance Due  Topic Date Due   Hepatitis C Screening  Never done   COLONOSCOPY (Pts 45-492yrInsurance coverage will need to be confirmed)  Never done   COVID-19 Vaccine (3 - Moderna risk series) 11/30/2019   INFLUENZA VACCINE  09/04/2020    Colorectal cancer screening: Referral to GI  placed to [employer] for [employer]. Pt aware the office will call re: appt.  Lung Cancer Screening: (Low Dose CT Chest recommended if Age 42-80 years, 30 pack-year currently smoking OR have quit w/in 15years.) does not qualify.    Additional Screening:  Hepatitis C Screening: does qualify; Completed.   Vision Screening: Recommended annual ophthalmology exams for early detection of glaucoma and other disorders of the eye. Is the patient up to date with their annual eye exam?  Yes   Who is the provider or what is the name of the office in which the patient attends annual eye exams? Referral placed.   If pt is not established with a provider, would they like to be referred to a provider to establish care? Yes .   Dental Screening: Recommended annual dental exams for proper oral hygiene  Community Resource Referral / Chronic Care Management: CRR required this visit?  No   CCM required this visit?  No      Plan:     I have personally reviewed and noted the following in the patient's chart:   Medical and social history Use of alcohol, tobacco or illicit drugs  Current medications and supplements including opioid prescriptions. Patient is not currently taking opioid prescriptions. Functional ability and status Nutritional status Physical activity Advanced directives List of other physicians Hospitalizations, surgeries, and ER visits in previous 12 months Vitals Screenings to include cognitive, depression, and falls Referrals and  appointments  In addition, I have reviewed and discussed with patient certain preventive protocols, quality metrics, and best practice recommendations. A written personalized care plan for preventive services as well as general preventive health recommendations were provided to patient.     Lonn Georgia, LPN   10/02/9369   Nurse Notes: AWV conducted over the phone with pt consent to televisit via audio. Pt was present in the home at the time of visit and provider off site. This call took approx 30 min to conduct.

## 2020-09-29 NOTE — Patient Instructions (Signed)
[name] , Thank you for taking time to come for your Medicare Wellness Visit. I appreciate your ongoing commitment to your health goals. Please review the following plan we discussed and let me know if I can assist you in the future.   Screening recommendations/referrals: Colonoscopy: Orders submitted for Cologuard Kit.   Recommended yearly ophthalmology/optometry visit for glaucoma screening and checkup: Referral submitted for new Opthamologist  Recommended yearly dental visit for hygiene and checkup  Vaccinations: Influenza vaccine: DUE  Pneumococcal vaccine: Completed  Tdap vaccine: Due - 11/16/2025  Shingles vaccine: eligible, please consult with your pharmacy for pricing.     Advanced directives: Not at this time.   Conditions/risks identified: None   Next appointment: 06/15/2021 @ 8:00am for Physical   Preventive Care 68 Years and Older, Male Preventive care refers to lifestyle choices and visits with your health care provider that can promote health and wellness. What does preventive care include? A yearly physical exam. This is also called an annual well check. Dental exams once or twice a year. Routine eye exams. Ask your health care provider how often you should have your eyes checked. Personal lifestyle choices, including: Daily care of your teeth and gums. Regular physical activity. Eating a healthy diet. Avoiding tobacco and drug use. Limiting alcohol use. Practicing safe sex. Taking low doses of aspirin every day. Taking vitamin and mineral supplements as recommended by your health care provider. What happens during an annual well check? The services and screenings done by your health care provider during your annual well check will depend on your age, overall health, lifestyle risk factors, and family history of disease. Counseling  Your health care provider may ask you questions about your: Alcohol use. Tobacco use. Drug use. Emotional well-being. Home and  relationship well-being. Sexual activity. Eating habits. History of falls. Memory and ability to understand (cognition). Work and work Statistician. Screening  You may have the following tests or measurements: Height, weight, and BMI. Blood pressure. Lipid and cholesterol levels. These may be checked every 5 years, or more frequently if you are over 42 years old. Skin check. Lung cancer screening. You may have this screening every year starting at age 68 if you have a 30-pack-year history of smoking and currently smoke or have quit within the past 15 years. Fecal occult blood test (FOBT) of the stool. You may have this test every year starting at age 68. Flexible sigmoidoscopy or colonoscopy. You may have a sigmoidoscopy every 5 years or a colonoscopy every 10 years starting at age 68. Prostate cancer screening. Recommendations will vary depending on your family history and other risks. Hepatitis C blood test. Hepatitis B blood test. Sexually transmitted disease (STD) testing. Diabetes screening. This is done by checking your blood sugar (glucose) after you have not eaten for a while (fasting). You may have this done every 1-3 years. Abdominal aortic aneurysm (AAA) screening. You may need this if you are a current or former smoker. Osteoporosis. You may be screened starting at age 68 if you are at high risk. Talk with your health care provider about your test results, treatment options, and if necessary, the need for more tests. Vaccines  Your health care provider may recommend certain vaccines, such as: Influenza vaccine. This is recommended every year. Tetanus, diphtheria, and acellular pertussis (Tdap, Td) vaccine. You may need a Td booster every 10 years. Zoster vaccine. You may need this after age 32. Pneumococcal 13-valent conjugate (PCV13) vaccine. One dose is recommended after age 38.  Pneumococcal polysaccharide (PPSV23) vaccine. One dose is recommended after age 4. Talk to your  health care provider about which screenings and vaccines you need and how often you need them. This information is not intended to replace advice given to you by your health care provider. Make sure you discuss any questions you have with your health care provider. Document Released: 02/17/2015 Document Revised: 10/11/2015 Document Reviewed: 11/22/2014 Elsevier Interactive Patient Education  2017 [place] Prevention in the Home Falls can cause injuries. They can happen to people of all ages. There are many things you can do to make your home safe and to help prevent falls. What can I do on the outside of my home? Regularly fix the edges of walkways and driveways and fix any cracks. Remove anything that might make you trip as you walk through a door, such as a raised step or threshold. Trim any bushes or trees on the path to your home. Use bright outdoor lighting. Clear any walking paths of anything that might make someone trip, such as rocks or tools. Regularly check to see if handrails are loose or broken. Make sure that both sides of any steps have handrails. Any raised decks and porches should have guardrails on the edges. Have any leaves, snow, or ice cleared regularly. Use sand or salt on walking paths during winter. Clean up any spills in your garage right away. This includes oil or grease spills. What can I do in the bathroom? Use night lights. Install grab bars by the toilet and in the tub and shower. Do not use towel bars as grab bars. Use non-skid mats or decals in the tub or shower. If you need to sit down in the shower, use a plastic, non-slip stool. Keep the floor dry. Clean up any water that spills on the floor as soon as it happens. Remove soap buildup in the tub or shower regularly. Attach bath mats securely with double-sided non-slip rug tape. Do not have throw rugs and other things on the floor that can make you trip. What can I do in the bedroom? Use night  lights. Make sure that you have a light by your bed that is easy to reach. Do not use any sheets or blankets that are too big for your bed. They should not hang down onto the floor. Have a firm chair that has side arms. You can use this for support while you get dressed. Do not have throw rugs and other things on the floor that can make you trip. What can I do in the kitchen? Clean up any spills right away. Avoid walking on wet floors. Keep items that you use a lot in easy-to-reach places. If you need to reach something above you, use a strong step stool that has a grab bar. Keep electrical cords out of the way. Do not use floor polish or wax that makes floors slippery. If you must use wax, use non-skid floor wax. Do not have throw rugs and other things on the floor that can make you trip. What can I do with my stairs? Do not leave any items on the stairs. Make sure that there are handrails on both sides of the stairs and use them. Fix handrails that are broken or loose. Make sure that handrails are as long as the stairways. Check any carpeting to make sure that it is firmly attached to the stairs. Fix any carpet that is loose or worn. Avoid having throw rugs at the  top or bottom of the stairs. If you do have throw rugs, attach them to the floor with carpet tape. Make sure that you have a light switch at the top of the stairs and the bottom of the stairs. If you do not have them, ask someone to add them for you. What else can I do to help prevent falls? Wear shoes that: Do not have high heels. Have rubber bottoms. Are comfortable and fit you well. Are closed at the toe. Do not wear sandals. If you use a stepladder: Make sure that it is fully opened. Do not climb a closed stepladder. Make sure that both sides of the stepladder are locked into place. Ask someone to hold it for you, if possible. Clearly mark and make sure that you can see: Any grab bars or handrails. First and last  steps. Where the edge of each step is. Use tools that help you move around (mobility aids) if they are needed. These include: Canes. Walkers. Scooters. Crutches. Turn on the lights when you go into a dark area. Replace any light bulbs as soon as they burn out. Set up your furniture so you have a clear path. Avoid moving your furniture around. If any of your floors are uneven, fix them. If there are any pets around you, be aware of where they are. Review your medicines with your doctor. Some medicines can make you feel dizzy. This can increase your chance of falling. Ask your doctor what other things that you can do to help prevent falls. This information is not intended to replace advice given to you by your health care provider. Make sure you discuss any questions you have with your health care provider. Document Released: 11/17/2008 Document Revised: 06/29/2015 Document Reviewed: 02/25/2014 Elsevier Interactive Patient Education  2017 [employer].

## 2020-09-29 NOTE — Addendum Note (Signed)
Addended by: Lonn Georgia on: 09/29/2020 03:27 PM   Modules accepted: Orders

## 2020-10-02 ENCOUNTER — Emergency Department (HOSPITAL_COMMUNITY)
Admission: EM | Admit: 2020-10-02 | Discharge: 2020-10-02 | Disposition: A | Discharge status: Home / Self Care | Payer: Medicare Other | Attending: Emergency Medicine | Admitting: Emergency Medicine

## 2020-10-02 ENCOUNTER — Encounter (HOSPITAL_COMMUNITY): Payer: Self-pay | Admitting: *Deleted

## 2020-10-02 ENCOUNTER — Other Ambulatory Visit: Payer: Self-pay

## 2020-10-02 DIAGNOSIS — R109 Unspecified abdominal pain: Secondary | ICD-10-CM | POA: Insufficient documentation

## 2020-10-02 DIAGNOSIS — N3001 Acute cystitis with hematuria: Secondary | ICD-10-CM | POA: Diagnosis not present

## 2020-10-02 DIAGNOSIS — N390 Urinary tract infection, site not specified: Secondary | ICD-10-CM

## 2020-10-02 DIAGNOSIS — N50819 Testicular pain, unspecified: Secondary | ICD-10-CM | POA: Diagnosis not present

## 2020-10-02 DIAGNOSIS — N3091 Cystitis, unspecified with hematuria: Secondary | ICD-10-CM

## 2020-10-02 DIAGNOSIS — R319 Hematuria, unspecified: Secondary | ICD-10-CM | POA: Insufficient documentation

## 2020-10-02 DIAGNOSIS — R030 Elevated blood-pressure reading, without diagnosis of hypertension: Secondary | ICD-10-CM

## 2020-10-02 DIAGNOSIS — R300 Dysuria: Secondary | ICD-10-CM | POA: Insufficient documentation

## 2020-10-02 LAB — URINALYSIS, ROUTINE W REFLEX MICROSCOPIC

## 2020-10-02 LAB — URINALYSIS, MICROSCOPIC (REFLEX)
Bacteria, UA: NONE SEEN
RBC / HPF: >50 RBC/hpf (ref 0–5)

## 2020-10-02 LAB — BASIC METABOLIC PANEL
Anion gap: 8 (ref 5–15)
BUN: 19 mg/dL (ref 8–23)
CO2: 24 mmol/L (ref 22–32)
Calcium: 8.9 mg/dL (ref 8.9–10.3)
Chloride: 106 mmol/L (ref 98–111)
Creatinine, Ser: 1.43 mg/dL — ABNORMAL HIGH (ref 0.61–1.24)
GFR, Estimated: 54 mL/min — ABNORMAL LOW (ref >60)
Glucose, Bld: 211 mg/dL — ABNORMAL HIGH (ref 70–99)
Potassium: 3.6 mmol/L (ref 3.5–5.1)
Sodium: 138 mmol/L (ref 135–145)

## 2020-10-02 LAB — CBC WITH DIFFERENTIAL/PLATELET
Abs Immature Granulocytes: 0.02 10*3/uL (ref 0.00–0.07)
Basophils Absolute: 0.1 10*3/uL (ref 0.0–0.1)
Basophils Relative: 1 %
Eosinophils Absolute: 0.1 10*3/uL (ref 0.0–0.5)
Eosinophils Relative: 1 %
HCT: 40.2 % (ref 39.0–52.0)
Hemoglobin: 13.5 g/dL (ref 13.0–17.0)
Immature Granulocytes: 0 %
Lymphocytes Relative: 18 %
Lymphs Abs: 1.4 10*3/uL (ref 0.7–4.0)
MCH: 30.9 pg (ref 26.0–34.0)
MCHC: 33.6 g/dL (ref 30.0–36.0)
MCV: 92 fL (ref 80.0–100.0)
Monocytes Absolute: 0.5 10*3/uL (ref 0.1–1.0)
Monocytes Relative: 6 %
Neutro Abs: 6.1 10*3/uL (ref 1.7–7.7)
Neutrophils Relative %: 74 %
Platelets: 192 10*3/uL (ref 150–400)
RBC: 4.37 MIL/uL (ref 4.22–5.81)
RDW: 12.9 % (ref 11.5–15.5)
WBC: 8.2 10*3/uL (ref 4.0–10.5)
nRBC: 0 % (ref 0.0–0.2)

## 2020-10-02 MED ORDER — CEFTRIAXONE SODIUM 1 G IJ SOLR
1.0000 g | Freq: Once | INTRAMUSCULAR | Status: AC
  Administered 2020-10-02: 1 g via INTRAMUSCULAR
  Filled 2020-10-02: qty 10

## 2020-10-02 MED ORDER — CEPHALEXIN 500 MG PO CAPS: 500.0000 mg | ORAL_CAPSULE | Freq: Four times a day (QID) | ORAL | 0 refills | Status: DC

## 2020-10-02 MED ORDER — LIDOCAINE HCL (PF) 1 % IJ SOLN
INTRAMUSCULAR | Status: AC
  Administered 2020-10-02: 2.1 mL via INTRAMUSCULAR
  Filled 2020-10-02: qty 30

## 2020-10-02 NOTE — ED Triage Notes (Signed)
Pt with testicle pain with blood in his urine starting today.  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  Burning with urination.

## 2020-10-02 NOTE — Discharge Instructions (Addendum)
It was our pleasure to provide your ER care today - we hope that you feel better.  Drink plenty of fluids/stay well hydrated. Take keflex (antibiotic) as prescribed.   Take acetaminophen or ibuprofen as need.   Follow up with urologist in one week - call office to arrange appointment.   Also, your blood pressure is high tonight - follow up with primary care doctor in 1-2 weeks.   Return to ER if worse, new symptoms, fevers, new, worsening or severe pain, persistent vomiting, unable to void or empty bladder, or other concern.

## 2020-10-02 NOTE — ED Provider Notes (Signed)
[facility] EMERGENCY DEPARTMENT Provider Note   CSN: [id number] Arrival date & time: 10/02/20  1718     History Chief Complaint  Patient presents with   Abdominal Pain    Suprapubic area   Dysuria    Russell Mckinney is a 70 y.o. male.  Patient with urinary urgency, dysuria, hematuria, and suprapubic discomfort. Symptoms acute onset today, moderate, persistent, dull, non radiating. No focal scrotal or testicular pain. No flank pain. Normal appetite. No nv. No diarrhea or constipation. No abd or flank trauma. No fever/chills. No anticoag use. No other abnormal bruising or bleeding. Feels as if is able to empty bladder fully.  No hx kidney stone. No hx uti.   The history is provided by the patient and medical records.  Testicle Pain Pertinent negatives include no chest pain, no headaches and no shortness of breath.      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   Encounter for well adult exam with abnormal findings 06/14/2020   History of anaphylaxis 06/14/2020   Immunization due 06/14/2020   Colon cancer screening 06/14/2020   Screening due 06/14/2020   Diverticulitis 02/11/2019   Situational anxiety 12/18/2018   Forgetfulness 12/18/2018   Sleep difficulties 12/18/2018   Other male erectile dysfunction 12/18/2018   Adjustment disorder with depressed mood 04/30/2016   Taking a statin medication 04/30/2016   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   Constipation 11/08/2013   Hypertriglyceridemia 11/08/2013   Shoulder joint pain 11/08/2013   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 Authorizing Provider  EPINEPHrine 0.3 mg/0.3 mL IJ SOAJ injection Inject 0.3 mg into the skin as directed. 06/14/20   Noreene Larsson, NP  sildenafil (REVATIO) 20 MG tablet Take 1-3 tablets (20-60 mg total) by mouth as directed. Take up to 4 hours before event. Patient not taking: Reported on 09/29/2020 11/10/18   Perlie Mayo, NP    Allergies    Codeine  Review of Systems   Review of Systems  Constitutional:  Negative for chills and fever.  HENT:  Negative for nosebleeds and sore throat.   Eyes:  Negative for redness.  Respiratory:  Negative for shortness of breath.   Cardiovascular:  Negative for chest pain.  Gastrointestinal:  Negative for vomiting.  Genitourinary:  Positive for dysuria and hematuria. Negative for flank pain, penile discharge, scrotal swelling and testicular pain.  Musculoskeletal:  Negative for back pain and neck pain.  Skin:  Negative for rash.  Neurological:  Negative for headaches.  Hematological:  Does not bruise/bleed easily.  Psychiatric/Behavioral:  Negative for confusion.    Physical Exam Updated Vital Signs BP (!) 176/85   Pulse (!) 52   Temp 98.1 F (36.7 C) (Oral)  Resp 17   Ht 1.778 m ('5\' 10"'$ )   Wt 80.7 kg   SpO2 99%   BMI 25.54 kg/m   Physical Exam Vitals and nursing note reviewed.  Constitutional:      Appearance: Normal appearance. He is well-developed.  HENT:     Head: Atraumatic.     Nose: Nose normal.     Mouth/Throat:     Mouth: Mucous membranes are moist.     Pharynx: Oropharynx is clear.  Eyes:     General: No scleral icterus.    Conjunctiva/sclera: Conjunctivae normal.  Neck:     Trachea: No tracheal deviation.  Cardiovascular:     Rate and Rhythm: Normal rate and  regular rhythm.     Pulses: Normal pulses.     Heart sounds: Normal heart sounds. No murmur heard.   No friction rub. No gallop.  Pulmonary:     Effort: Pulmonary effort is normal. No accessory muscle usage or respiratory distress.     Breath sounds: Normal breath sounds.  Abdominal:     General: Bowel sounds are normal. There is no distension.     Palpations: Abdomen is soft. There is no mass.     Tenderness: There is no guarding or rebound.     Hernia: No hernia is present.     Comments: Mild suprapubic tenderness. No suprapubic fullness or distension.   Genitourinary:    Comments: No cva tenderness. Normal external gu exam - no scrotal or testicular pain, swelling, or tenderness.  No penile discharge.  Musculoskeletal:        General: No swelling.     Cervical back: Normal range of motion and neck supple. No rigidity.  Skin:    General: Skin is warm and dry.     Findings: No rash.  Neurological:     Mental Status: He is alert.     Comments: Alert, speech clear.   Psychiatric:        Mood and Affect: Mood normal.    ED Results / Procedures / Treatments   Labs (all labs ordered are listed, but only abnormal results are displayed) Results for orders placed or performed during the hospital encounter of 10/02/20  CBC with Differential  Result Value Ref Range   WBC 8.2 4.0 - 10.5 K/uL   RBC 4.37 4.22 - 5.81 MIL/uL   Hemoglobin 13.5 13.0 - 17.0 g/dL   HCT 40.2 39.0 - 52.0 %   MCV 92.0 80.0 - 100.0 fL   MCH 30.9 26.0 - 34.0 pg   MCHC 33.6 30.0 - 36.0 g/dL   RDW 12.9 11.5 - 15.5 %   Platelets 192 150 - 400 K/uL   nRBC 0.0 0.0 - 0.2 %   Neutrophils Relative % 74 %   Neutro Abs 6.1 1.7 - 7.7 K/uL   Lymphocytes Relative 18 %   Lymphs Abs 1.4 0.7 - 4.0 K/uL   Monocytes Relative 6 %   Monocytes Absolute 0.5 0.1 - 1.0 K/uL   Eosinophils Relative 1 %   Eosinophils Absolute 0.1 0.0 - 0.5 K/uL   Basophils Relative 1 %   Basophils Absolute 0.1 0.0 - 0.1 K/uL   Immature  Granulocytes 0 %   Abs Immature Granulocytes 0.02 0.00 - 0.07 K/uL  Basic metabolic panel  Result Value Ref Range   Sodium 138 135 - 145 mmol/L   Potassium 3.6 3.5 - 5.1 mmol/L   Chloride 106 98 - 111 mmol/L   CO2 24 22 - 32 mmol/L  Glucose, Bld 211 (H) 70 - 99 mg/dL   BUN 19 8 - 23 mg/dL   Creatinine, Ser 1.43 (H) 0.61 - 1.24 mg/dL   Calcium 8.9 8.9 - 10.3 mg/dL   GFR, Estimated 54 (L) >60 mL/min   Anion gap 8 5 - 15  Urinalysis, Routine w reflex microscopic Urine, Clean Catch  Result Value Ref Range   Color, Urine RED (A) YELLOW   APPearance TURBID (A) CLEAR   Specific Gravity, Urine  1.005 - 1.030    TEST NOT REPORTED DUE TO COLOR INTERFERENCE OF URINE PIGMENT   pH  5.0 - 8.0    TEST NOT REPORTED DUE TO COLOR INTERFERENCE OF URINE PIGMENT   Glucose, UA (A) NEGATIVE mg/dL    TEST NOT REPORTED DUE TO COLOR INTERFERENCE OF URINE PIGMENT   Hgb urine dipstick (A) NEGATIVE    TEST NOT REPORTED DUE TO COLOR INTERFERENCE OF URINE PIGMENT   Bilirubin Urine (A) NEGATIVE    TEST NOT REPORTED DUE TO COLOR INTERFERENCE OF URINE PIGMENT   Ketones, ur (A) NEGATIVE mg/dL    TEST NOT REPORTED DUE TO COLOR INTERFERENCE OF URINE PIGMENT   Protein, ur (A) NEGATIVE mg/dL    TEST NOT REPORTED DUE TO COLOR INTERFERENCE OF URINE PIGMENT   Nitrite (A) NEGATIVE    TEST NOT REPORTED DUE TO COLOR INTERFERENCE OF URINE PIGMENT   Leukocytes,Ua (A) NEGATIVE    TEST NOT REPORTED DUE TO COLOR INTERFERENCE OF URINE PIGMENT  Urinalysis, Microscopic (reflex)  Result Value Ref Range   RBC / HPF >50 0 - 5 RBC/hpf   WBC, UA 11-20 0 - 5 WBC/hpf   Bacteria, UA NONE SEEN NONE SEEN   Squamous Epithelial / LPF 0-5 0 - 5   Sperm, UA PRESENT       EKG None  Radiology No results found.  Procedures Procedures   Medications Ordered in ED Medications  cefTRIAXone (ROCEPHIN) injection 1 g (has no administration in time range)    ED Course  I have reviewed the triage vital signs and the nursing  notes.  Pertinent labs & imaging results that were available during my care of the patient were reviewed by me and considered in my medical decision making (see chart for details).    MDM Rules/Calculators/A&P                           Labs sent.   Reviewed nursing notes and prior charts for additional history.  Remote/prior testicle u/s neg for torsion. Currently no testicle or scrotal pain, swelling, or tenderness.  Labs reviewed/interpreted by me - ua w many rbcs, 11-20 wbc. Culture sent. Rocephin im.  Pt currently appears stable for d/c.    Final Clinical Impression(s) / ED Diagnoses Final diagnoses:  None    Rx / DC Orders ED Discharge Orders     None        Lajean Saver, MD 10/02/20 2020

## 2020-10-03 ENCOUNTER — Ambulatory Visit: Payer: Medicare Other | Admitting: Urology

## 2020-10-03 ENCOUNTER — Encounter: Payer: Self-pay | Admitting: Urology

## 2020-10-03 VITALS — BP 125/76 | HR 66

## 2020-10-03 DIAGNOSIS — F5232 Male orgasmic disorder: Secondary | ICD-10-CM

## 2020-10-03 DIAGNOSIS — N5201 Erectile dysfunction due to arterial insufficiency: Secondary | ICD-10-CM

## 2020-10-03 DIAGNOSIS — N3001 Acute cystitis with hematuria: Secondary | ICD-10-CM | POA: Diagnosis not present

## 2020-10-03 MED ORDER — TADALAFIL 20 MG PO TABS: 20.0000 mg | ORAL_TABLET | ORAL | 11 refills | Status: DC | PRN

## 2020-10-03 NOTE — Progress Notes (Signed)
H&P  Chief Complaint: Recent hemorrhagic cystitis, history of ED  History of Present Illness: 70 year old male had this appointment set up for a few weeks.  It was to discuss erectile dysfunction.  Last night he presented to the emergency room with gross hematuria, dysuria, suprapubic pain and obvious cystitis.  He was given IM antibiotic as well as a prescription for Keflex.  Since that visit his urine has cleared and his lower urinary tract symptoms have improved.  He has no prior history of gross hematuria, urinary tract infection or significant urologic issues.  He is a non-smoker.  He also has erectile dysfunction.  He has tried 100 mg of Viagra before, quite a few years ago.  According to the patient, this helped at first, but eventually he has not had total improvement with this medicine, he has early detumescence.  He also has a history of anorgasmia.  His wife died last year.  His last sexual encounter before that was 14 to 15 years ago.  He is getting radiowave therapy in [place] to his penis.  Past Medical History:  Diagnosis Date   Diverticulitis    2017   High cholesterol    High cholesterol     Past Surgical History:  Procedure Laterality Date   TONSILLECTOMY      Home Medications:  Allergies as of 10/03/2020       Reactions   Codeine Other (See Comments)   Other reaction(s): Other (See Comments) GI Upset  GI Upset  GI Upset         Medication List        Accurate as of October 03, 2020  2:12 PM. If you have any questions, ask your nurse or doctor.          cephALEXin 500 MG capsule Commonly known as: Keflex Take 1 capsule (500 mg total) by mouth 4 (four) times daily.   EPINEPHrine 0.3 mg/0.3 mL Soaj injection Commonly known as: EPI-PEN Inject 0.3 mg into the skin as directed.   sildenafil 20 MG tablet Commonly known as: REVATIO Take 1-3 tablets (20-60 mg total) by mouth as directed. Take up to 4 hours before event.        Allergies:  Allergies   Allergen Reactions   Codeine Other (See Comments)    Other reaction(s): Other (See Comments) GI Upset  GI Upset  GI Upset      Family History  Problem Relation Age of Onset   Diabetes Brother     Social History:  reports that he has never smoked. He has never used smokeless tobacco. He reports that he does not drink alcohol and does not use drugs.  ROS: A complete review of systems was performed.  All systems are negative except for pertinent findings as noted.  Physical Exam:  Vital signs in last 24 hours: There were no vitals taken for this visit. Constitutional:  Alert and oriented, No acute distress Cardiovascular: Regular rate  Respiratory: Normal respiratory effort GI: Abdomen is soft, nontender, nondistended, no abdominal masses. No CVAT.  No inguinal hernias. Genitourinary: Normal male phallus, testes are descended bilaterally and non-tender and without masses, scrotum is normal in appearance without lesions or masses, perineum is normal on inspection.  Prostate 40 g, symmetrical, nonnodular, nontender. Lymphatic: No lymphadenopathy Neurologic: Grossly intact, no focal deficits Psychiatric: Normal mood and affect  Laboratory Data:  Recent Labs    10/02/20 1809  WBC 8.2  HGB 13.5  HCT 40.2  PLT 192  Recent Labs    10/02/20 1809  NA 138  K 3.6  CL 106  GLUCOSE 211*  BUN 19  CALCIUM 8.9  CREATININE 1.43*     Results for orders placed or performed during the hospital encounter of 10/02/20 (from the past 24 hour(s))  Urinalysis, Routine w reflex microscopic Urine, Clean Catch     Status: Abnormal   Collection Time: 10/02/20  5:50 PM  Result Value Ref Range   Color, Urine RED (A) YELLOW   APPearance TURBID (A) CLEAR   Specific Gravity, Urine  1.005 - 1.030    TEST NOT REPORTED DUE TO COLOR INTERFERENCE OF URINE PIGMENT   pH  5.0 - 8.0    TEST NOT REPORTED DUE TO COLOR INTERFERENCE OF URINE PIGMENT   Glucose, UA (A) NEGATIVE mg/dL    TEST NOT  REPORTED DUE TO COLOR INTERFERENCE OF URINE PIGMENT   Hgb urine dipstick (A) NEGATIVE    TEST NOT REPORTED DUE TO COLOR INTERFERENCE OF URINE PIGMENT   Bilirubin Urine (A) NEGATIVE    TEST NOT REPORTED DUE TO COLOR INTERFERENCE OF URINE PIGMENT   Ketones, ur (A) NEGATIVE mg/dL    TEST NOT REPORTED DUE TO COLOR INTERFERENCE OF URINE PIGMENT   Protein, ur (A) NEGATIVE mg/dL    TEST NOT REPORTED DUE TO COLOR INTERFERENCE OF URINE PIGMENT   Nitrite (A) NEGATIVE    TEST NOT REPORTED DUE TO COLOR INTERFERENCE OF URINE PIGMENT   Leukocytes,Ua (A) NEGATIVE    TEST NOT REPORTED DUE TO COLOR INTERFERENCE OF URINE PIGMENT  Urinalysis, Microscopic (reflex)     Status: None   Collection Time: 10/02/20  5:50 PM  Result Value Ref Range   RBC / HPF >50 0 - 5 RBC/hpf   WBC, UA 11-20 0 - 5 WBC/hpf   Bacteria, UA NONE SEEN NONE SEEN   Squamous Epithelial / LPF 0-5 0 - 5   Sperm, UA PRESENT   CBC with Differential     Status: None   Collection Time: 10/02/20  6:09 PM  Result Value Ref Range   WBC 8.2 4.0 - 10.5 K/uL   RBC 4.37 4.22 - 5.81 MIL/uL   Hemoglobin 13.5 13.0 - 17.0 g/dL   HCT 40.2 39.0 - 52.0 %   MCV 92.0 80.0 - 100.0 fL   MCH 30.9 26.0 - 34.0 pg   MCHC 33.6 30.0 - 36.0 g/dL   RDW 12.9 11.5 - 15.5 %   Platelets 192 150 - 400 K/uL   nRBC 0.0 0.0 - 0.2 %   Neutrophils Relative % 74 %   Neutro Abs 6.1 1.7 - 7.7 K/uL   Lymphocytes Relative 18 %   Lymphs Abs 1.4 0.7 - 4.0 K/uL   Monocytes Relative 6 %   Monocytes Absolute 0.5 0.1 - 1.0 K/uL   Eosinophils Relative 1 %   Eosinophils Absolute 0.1 0.0 - 0.5 K/uL   Basophils Relative 1 %   Basophils Absolute 0.1 0.0 - 0.1 K/uL   Immature Granulocytes 0 %   Abs Immature Granulocytes 0.02 0.00 - 0.07 K/uL  Basic metabolic panel     Status: Abnormal   Collection Time: 10/02/20  6:09 PM  Result Value Ref Range   Sodium 138 135 - 145 mmol/L   Potassium 3.6 3.5 - 5.1 mmol/L   Chloride 106 98 - 111 mmol/L   CO2 24 22 - 32 mmol/L   Glucose,  Bld 211 (H) 70 - 99 mg/dL   BUN 19 8 -  23 mg/dL   Creatinine, Ser 1.43 (H) 0.61 - 1.24 mg/dL   Calcium 8.9 8.9 - 10.3 mg/dL   GFR, Estimated 54 (L) >60 mL/min   Anion gap 8 5 - 15   No results found for this or any previous visit (from the past 240 hour(s)).  Renal Function: Recent Labs    10/02/20 1809  CREATININE 1.43*   I have reviewed prior pt notes  I have reviewed notes from referring/previous physicians  I have reviewed urinalysis results  I have reviewed prior urine culture    Impression/Assessment:  1.  Acute cystitis with gross hematuria, improving.  First episode.  Doubt significant underlying abnormalities  2.  Erectile dysfunction, organic.  Apparently he has failed sildenafil before  Plan:  1.  Reassurance regarding his cystitis.  We will need to bring him back and recheck his urine following completion of his Keflex  2.  I sent in a prescription for tadalafil 20 mg, use as needed  3.  I will have him come back in a couple of months to check his symptoms of both the cystitis and the ED  4.  Strongly recommend cardiovascular exercise

## 2020-10-03 NOTE — Progress Notes (Signed)
Urological Symptom Review  Patient is experiencing the following symptoms: Urinary tract infection Erection problems (male only)   Review of Systems  Gastrointestinal (upper)  : Negative for upper GI symptoms  Gastrointestinal (lower) : Diarrhea  Constitutional : Negative for symptoms  Skin: Negative for skin symptoms  Eyes: Negative for eye symptoms  Ear/Nose/Throat : Negative for Ear/Nose/Throat symptoms  Hematologic/Lymphatic: Negative for Hematologic/Lymphatic symptoms  Cardiovascular : Negative for cardiovascular symptoms  Respiratory : Negative for respiratory symptoms  Endocrine: Negative for endocrine symptoms  Musculoskeletal: Negative for musculoskeletal symptoms  Neurological: Negative for neurological symptoms  Psychologic: Depression

## 2020-10-04 LAB — URINE CULTURE: Culture: NO GROWTH

## 2020-12-05 ENCOUNTER — Ambulatory Visit: Payer: Medicare Other | Admitting: Urology

## 2020-12-20 DIAGNOSIS — Z1211 Encounter for screening for malignant neoplasm of colon: Secondary | ICD-10-CM | POA: Diagnosis not present

## 2020-12-26 LAB — COLOGUARD: COLOGUARD: NEGATIVE

## 2021-01-10 NOTE — Progress Notes (Signed)
Cologuard is negative, so we can repeat this in 3 years.

## 2021-01-23 ENCOUNTER — Ambulatory Visit (INDEPENDENT_AMBULATORY_CARE_PROVIDER_SITE_OTHER): Payer: Medicare Other | Admitting: Urology

## 2021-01-23 ENCOUNTER — Other Ambulatory Visit: Payer: Self-pay

## 2021-01-23 ENCOUNTER — Encounter: Payer: Self-pay | Admitting: Urology

## 2021-01-23 VITALS — BP 119/63 | HR 67

## 2021-01-23 DIAGNOSIS — F5232 Male orgasmic disorder: Secondary | ICD-10-CM

## 2021-01-23 DIAGNOSIS — N5201 Erectile dysfunction due to arterial insufficiency: Secondary | ICD-10-CM

## 2021-01-23 DIAGNOSIS — N3001 Acute cystitis with hematuria: Secondary | ICD-10-CM

## 2021-01-23 NOTE — Progress Notes (Signed)
Urological Symptom Review  Patient is experiencing the following symptoms: Erection problems (male only)   Review of Systems  Gastrointestinal (upper)  : Negative for upper GI symptoms  Gastrointestinal (lower) : Negative for lower GI symptoms  Constitutional : Negative for symptoms  Skin: Skin rash/lesion  Eyes: Negative for eye symptoms  Ear/Nose/Throat : Negative for Ear/Nose/Throat symptoms  Hematologic/Lymphatic: Negative for Hematologic/Lymphatic symptoms  Cardiovascular : Negative for cardiovascular symptoms  Respiratory : Negative for respiratory symptoms  Endocrine: Negative for endocrine symptoms  Musculoskeletal: Negative for musculoskeletal symptoms  Neurological: Negative for neurological symptoms  Psychologic: Negative for psychiatric symptoms

## 2021-01-23 NOTE — Progress Notes (Signed)
History of Present Illness:    68.30.2022: Initial visit was to discuss erectile dysfunction.  Last night he presented to the emergency room with gross hematuria, dysuria, suprapubic pain and obvious cystitis.  He was given IM antibiotic as well as a prescription for Keflex.  Since that visit his urine has cleared and his lower urinary tract symptoms have improved.  He has no prior history of gross hematuria, urinary tract infection or significant urologic issues.  He is a non-smoker.  He also has erectile dysfunction.  He has tried 100 mg of Viagra before, quite a few years ago.  According to the patient, this helped at first, but eventually he has not had total improvement with this medicine, he has early detumescence.  He also has a history of anorgasmia.  His wife died last year.  His last sexual encounter before that was 14 to 15 years ago.  He is getting radiowave therapy in [place] to his penis.  12.20.2022: Still having a difficult time with erections despite having been given Cialis and trying that a few times.  He has an 80% erection initially but early detumescence.  He has no significant side effects from this medicine.  Past Medical History:  Diagnosis Date   Diverticulitis    2017   High cholesterol    High cholesterol     Past Surgical History:  Procedure Laterality Date   TONSILLECTOMY      Home Medications:  Allergies as of 01/23/2021       Reactions   Codeine Other (See Comments)   Other reaction(s): Other (See Comments) GI Upset  GI Upset  GI Upset         Medication List        Accurate as of January 23, 2021 11:45 AM. If you have any questions, ask your nurse or doctor.          cephALEXin 500 MG capsule Commonly known as: Keflex Take 1 capsule (500 mg total) by mouth 4 (four) times daily.   EPINEPHrine 0.3 mg/0.3 mL Soaj injection Commonly known as: EPI-PEN Inject 0.3 mg into the skin as directed.   sildenafil 20 MG tablet Commonly known as:  REVATIO Take 1-3 tablets (20-60 mg total) by mouth as directed. Take up to 4 hours before event.   tadalafil 20 MG tablet Commonly known as: CIALIS Take 1 tablet (20 mg total) by mouth as needed for erectile dysfunction.        Allergies:  Allergies  Allergen Reactions   Codeine Other (See Comments)    Other reaction(s): Other (See Comments) GI Upset  GI Upset  GI Upset      Family History  Problem Relation Age of Onset   Diabetes Brother     Social History:  reports that he has never smoked. He has never used smokeless tobacco. He reports that he does not drink alcohol and does not use drugs.  ROS: A complete review of systems was performed.  All systems are negative except for pertinent findings as noted.  Physical Exam:  Vital signs in last 24 hours: There were no vitals taken for this visit. Constitutional:  Alert and oriented, No acute distress Cardiovascular: Regular rate  Respiratory: Normal respiratory effort Neurologic: Grossly intact, no focal deficits Psychiatric: Normal mood and affect  I have reviewed prior pt notes  I have reviewed notes from referring/previous physicians  I have reviewed prior urine culture   Impression/Assessment:  1.  ED, not significantly improved as of  yet with medical therapy including Cialis and shockwave treatment to the penis done elsewhere  2.  Ejaculatory dysfunction  Plan:  1.  At this point, I did tell him his next step would be to learn intracorporeal injections.  I gave him information on this.  He does not want to get a prescription yet  2.  He will give [place] a call if he wants to proceed with this and we will send in a prescription for alprostadil and schedule appointment for training of injections here

## 2021-02-03 ENCOUNTER — Ambulatory Visit
Admission: EM | Admit: 2021-02-03 | Discharge: 2021-02-03 | Disposition: A | Discharge status: Home / Self Care | Payer: Medicare Other | Attending: Family Medicine | Admitting: Family Medicine

## 2021-02-03 ENCOUNTER — Other Ambulatory Visit: Payer: Self-pay

## 2021-02-03 DIAGNOSIS — N390 Urinary tract infection, site not specified: Secondary | ICD-10-CM

## 2021-02-03 DIAGNOSIS — R310 Gross hematuria: Secondary | ICD-10-CM | POA: Diagnosis not present

## 2021-02-03 LAB — POCT URINALYSIS DIP (MANUAL ENTRY)
Glucose, UA: NEGATIVE mg/dL
Ketones, POC UA: NEGATIVE mg/dL
Nitrite, UA: NEGATIVE
Protein Ur, POC: 100 mg/dL — AB
Spec Grav, UA: >=1.03 — AB (ref 1.010–1.025)
Urobilinogen, UA: 0.2 E.U./dL
pH, UA: 6 (ref 5.0–8.0)

## 2021-02-03 MED ORDER — CEPHALEXIN 500 MG PO CAPS: 500.0000 mg | ORAL_CAPSULE | Freq: Four times a day (QID) | ORAL | 0 refills | Status: DC

## 2021-02-03 MED ORDER — CEFTRIAXONE SODIUM 500 MG IJ SOLR
500.0000 mg | Freq: Once | INTRAMUSCULAR | Status: AC
  Administered 2021-02-03: 500 mg via INTRAMUSCULAR

## 2021-02-03 NOTE — ED Provider Notes (Signed)
[facility]    CSN: [id number] Arrival date & time: 02/03/21  1110      History   Chief Complaint Chief Complaint  Patient presents with   Urinary Tract Infection    HPI Russell Mckinney is a 70 y.o. male.   Presenting today with 1 day history of gross hematuria, groin pain.  Denies fever, chills, nausea, vomiting, abdominal pain, bowel changes, dysuria, urinary frequency.  Had a very similar episode in August for which she was evaluated in the emergency department and states he was given a shot of Rocephin and antibiotics for urinary tract infection which almost immediately cleared symptoms up.  Followed by urology for erectile dysfunction.   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   Encounter for well adult exam with abnormal findings 06/14/2020   History of anaphylaxis 06/14/2020   Immunization due 06/14/2020   Colon cancer screening 06/14/2020   Screening due 06/14/2020   Diverticulitis 02/11/2019   Situational anxiety 12/18/2018   Forgetfulness 12/18/2018   Sleep difficulties 12/18/2018   Other male erectile dysfunction 12/18/2018   Adjustment disorder with depressed mood 04/30/2016   Taking a statin medication 04/30/2016   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 Authorizing Provider  cephALEXin (KEFLEX) 500 MG capsule Take 1 capsule (500 mg total) by mouth 4 (four) times daily. 02/03/21   Volney American, PA-C  EPINEPHrine 0.3 mg/0.3 mL IJ SOAJ injection Inject 0.3 mg into the skin as directed. 06/14/20   Noreene Larsson, NP  sildenafil (REVATIO) 20 MG tablet Take 1-3 tablets (20-60 mg total) by mouth as directed. Take up to 4 hours before event. 11/10/18   Perlie Mayo, NP  tadalafil (CIALIS) 20 MG tablet Take 1 tablet (20 mg total) by mouth as needed for erectile dysfunction. 10/03/20   Franchot Gallo, MD    Family History Family History  Problem Relation Age of Onset   Diabetes Brother    Social History Social History   Tobacco Use   Smoking status: Never   Smokeless tobacco: Never  Vaping Use   Vaping Use: Never used  Substance Use Topics   Alcohol use: No   Drug use: No    Allergies   Codeine   Review of Systems Review of Systems Per HPI  Physical Exam Triage Vital Signs ED Triage Vitals  Enc Vitals Group     BP 02/03/21 1404 (!) 147/84     Pulse Rate 02/03/21 1404 61     Resp 02/03/21 1404 18     Temp 02/03/21 1404 98.8 F (37.1 C)     Temp Source 02/03/21 1404 Oral     SpO2 02/03/21 1404 97 %     Weight --      Height --      Head Circumference --      Peak Flow --  Pain Score 02/03/21 1401 3     Pain Loc --      Pain Edu? --      Excl. in [place]? --    No data found.  Updated Vital Signs BP (!) 147/84 (BP Location: Right Arm)    Pulse 61    Temp 98.8 F (37.1 C) (Oral)    Resp 18    SpO2 97%   Visual Acuity Right Eye Distance:   Left Eye Distance:   Bilateral Distance:    Right Eye Near:   Left Eye Near:    Bilateral Near:     Physical Exam Vitals and nursing note reviewed.  Constitutional:      Appearance: Normal appearance.  HENT:     Head: Atraumatic.     Mouth/Throat:     Mouth: Mucous membranes are moist.  Eyes:     Extraocular Movements: Extraocular movements intact.     Conjunctiva/sclera: Conjunctivae normal.  Cardiovascular:     Rate and Rhythm: Normal rate and regular rhythm.  Pulmonary:     Effort:  Pulmonary effort is normal.     Breath sounds: Normal breath sounds.  Abdominal:     General: Bowel sounds are normal. There is no distension.     Palpations: Abdomen is soft.     Tenderness: There is no abdominal tenderness. There is no guarding.  Musculoskeletal:        General: Normal range of motion.     Cervical back: Normal range of motion and neck supple.  Skin:    General: Skin is warm and dry.  Neurological:     General: No focal deficit present.     Mental Status: He is oriented to person, place, and time.  Psychiatric:        Mood and Affect: Mood normal.        Thought Content: Thought content normal.        Judgment: Judgment normal.     UC Treatments / Results  Labs (all labs ordered are listed, but only abnormal results are displayed) Labs Reviewed  POCT URINALYSIS DIP (MANUAL ENTRY) - Abnormal; Notable for the following components:      Result Value   Color, UA other (*)    Clarity, UA turbid (*)    Bilirubin, UA small (*)    Spec Grav, UA >=1.030 (*)    Blood, UA large (*)    Protein Ur, POC =100 (*)    Leukocytes, UA Trace (*)    All other components within normal limits  URINE CULTURE    EKG   Radiology No results found.  Procedures Procedures (including critical care time)  Medications Ordered in UC Medications  cefTRIAXone (ROCEPHIN) injection 500 mg (500 mg Intramuscular Given 02/03/21 1455)    Initial Impression / Assessment and Plan / UC Course  I have reviewed the triage vital signs and the nursing notes.  Pertinent labs & imaging results that were available during my care of the patient were reviewed by me and considered in my medical decision making (see chart for details).     Vital signs reassuring, he is overall well-appearing today.  We will treat for urinary tract infection shown on urinalysis with IM Rocephin, Keflex.  Return for acutely worsening symptoms  Final Clinical Impressions(s) / UC Diagnoses   Final diagnoses:   Acute lower UTI (urinary tract infection)  Gross hematuria   Discharge Instructions   None    ED Prescriptions     Medication  Sig Dispense Auth. Provider   cephALEXin (KEFLEX) 500 MG capsule Take 1 capsule (500 mg total) by mouth 4 (four) times daily. 28 capsule Volney American, [place]      PDMP not reviewed this encounter.   Volney American, [place] 02/03/21 1458

## 2021-02-03 NOTE — ED Triage Notes (Signed)
Patient states there was a dicoloration of his urine last night and this morning he saw there was blood in his urine.   He states that he had the same thing happen in August and was prescribed antibiotics and he received a shot in his backside.   Denies Fever    Denis meds

## 2021-02-04 LAB — URINE CULTURE: Culture: NO GROWTH

## 2021-06-15 ENCOUNTER — Encounter: Payer: Medicare Other | Admitting: Family Medicine

## 2021-06-15 ENCOUNTER — Encounter: Payer: Medicare Other | Admitting: Nurse Practitioner

## 2021-06-15 ENCOUNTER — Encounter: Payer: Self-pay | Admitting: Nurse Practitioner

## 2021-06-15 ENCOUNTER — Ambulatory Visit (INDEPENDENT_AMBULATORY_CARE_PROVIDER_SITE_OTHER): Payer: Medicare Other | Admitting: Nurse Practitioner

## 2021-06-15 VITALS — BP 108/63 | HR 63 | Ht 70.0 in | Wt 185.1 lb

## 2021-06-15 DIAGNOSIS — H543 Unqualified visual loss, both eyes: Secondary | ICD-10-CM

## 2021-06-15 DIAGNOSIS — S41112A Laceration without foreign body of left upper arm, initial encounter: Secondary | ICD-10-CM | POA: Insufficient documentation

## 2021-06-15 DIAGNOSIS — Z0000 Encounter for general adult medical examination without abnormal findings: Secondary | ICD-10-CM | POA: Insufficient documentation

## 2021-06-15 DIAGNOSIS — Z0001 Encounter for general adult medical examination with abnormal findings: Secondary | ICD-10-CM

## 2021-06-15 NOTE — Patient Instructions (Addendum)
Please get your shingles vaccine and COVID booster at your pharmacy  ? ?Please get your fasting labs done as discussed  ? ? ?It is important that you exercise regularly at least 30 minutes 5 times a week.  ?Think about what you will eat, plan ahead. ?Choose " clean, green, fresh or frozen" over canned, processed or packaged foods which are more sugary, salty and fatty. ?70 to 75% of food eaten should be vegetables and fruit. ?Three meals at set times with snacks allowed between meals, but they must be fruit or vegetables. ?Aim to eat over a 12 hour period , example 7 am to 7 pm, and STOP after  your last meal of the day. ?Drink water,generally about 64 ounces per day, no other drink is as healthy. Fruit juice is best enjoyed in a healthy way, by EATING the fruit. ? ?Thanks for choosing [facility], we consider it a privelige to serve you. ? ?

## 2021-06-15 NOTE — Progress Notes (Addendum)
? ?Complete physical exam ? ?Patient: Russell Mckinney   DOB: 1950-05-08   71 y.o. Male  MRN: [id number] ? ?Subjective:  ?  ?Chief Complaint  ?Patient presents with  ? Annual Exam  ?  Cpe, spot on L arm concerned  ? ? ?Khadim Lundberg is a 71 y.o. male who presents today for a complete physical exam. He reports consuming a low sodium diet. The patient does not participate in regular exercise at present. He generally feels fairly well. He reports sleeping well. He does have additional problems to discuss today. He lost his wife two years states that this are getting better well for him, has been eating more vegetables and drinking more water.  ? ? ? ?Last eye exam is over 10 years ago , wears prescription glasses need a referral to a new opthamologist , see floaters sometimes, denies itchy eyes, pain , eye discharge.   ? ? ?Last dental exam was two weeks ago when he had a cavity filled, goes Dr Andree [facility].   ? ?Due for shingles vaccine and COVID booster , need for both vaccines dicussed pt encouraged to get both vaccines at their pharmacy.  ? ?Decline HEP C and PSA labs today .  ? ? ?Has a skin tear on the left forearm, site is clean and dry no sign of infection noted,  ?Continue to keep site clean dry if it open to air to promote healing.  ? ? ? ?Most recent fall risk assessment: ? ?  06/15/2021  ?  8:11 AM  ?Fall Risk   ?Falls in the past year? 0  ?Number falls in past yr: 0  ?Injury with Fall? 0  ?Risk for fall due to : No Fall Risks  ?Follow up Falls evaluation completed  ? ?  ?Most recent depression screenings: ? ?  06/15/2021  ?  8:12 AM 09/29/2020  ?  3:07 PM  ?PHQ 2/9 Scores  ?PHQ - 2 Score 1 4  ?PHQ- 9 Score  4  ? ? ? ? ? ? ?Patient Care Team: ?Renee Rival, FNP as PCP - General (Nurse Practitioner)  ? ?Outpatient Medications Prior to Visit  ?Medication Sig  ? EPINEPHrine 0.3 mg/0.3 mL IJ SOAJ injection Inject 0.3 mg into the skin as directed.  ? [DISCONTINUED] cephALEXin (KEFLEX) 500 MG capsule  Take 1 capsule (500 mg total) by mouth 4 (four) times daily. (Patient not taking: Reported on 06/15/2021)  ? [DISCONTINUED] sildenafil (REVATIO) 20 MG tablet Take 1-3 tablets (20-60 mg total) by mouth as directed. Take up to 4 hours before event. (Patient not taking: Reported on 06/15/2021)  ? [DISCONTINUED] tadalafil (CIALIS) 20 MG tablet Take 1 tablet (20 mg total) by mouth as needed for erectile dysfunction. (Patient not taking: Reported on 06/15/2021)  ? ?No facility-administered medications prior to visit.  ? ? ?Review of Systems  ?Constitutional: Negative.  Negative for chills, diaphoresis, fever, malaise/fatigue and weight loss.  ?HENT: Negative.    ?Eyes:  Negative for photophobia, pain, discharge and redness.  ?Respiratory: Negative.    ?Cardiovascular: Negative.   ?Gastrointestinal: Negative.   ?Genitourinary: Negative.   ?Musculoskeletal: Negative.   ?Skin: Negative.   ?     Skin tear  ?Neurological: Negative.   ?Endo/Heme/Allergies: Negative.   ?Psychiatric/Behavioral: Negative.    ? ? ? ? ?   ?Objective:  ? ?  ?BP 108/63   Pulse 63   Ht 5' 10" (1.778 m)   Wt 185 lb 1.9 oz (84 kg)  SpO2 96%   BMI 26.56 kg/m?  ? ? ?Physical Exam ?Constitutional:   ?   General: He is not in acute distress. ?   Appearance: Normal appearance. He is normal weight. He is not ill-appearing, toxic-appearing or diaphoretic.  ?HENT:  ?   Head: Normocephalic and atraumatic.  ?   Right Ear: Tympanic membrane, ear canal and external ear normal. There is no impacted cerumen.  ?   Left Ear: Tympanic membrane, ear canal and external ear normal. There is no impacted cerumen.  ?   Nose: Nose normal. No congestion or rhinorrhea.  ?   Mouth/Throat:  ?   Mouth: Mucous membranes are moist.  ?   Pharynx: Oropharynx is clear. No oropharyngeal exudate or posterior oropharyngeal erythema.  ?Eyes:  ?   General: No scleral icterus.    ?   Right eye: No discharge.     ?   Left eye: No discharge.  ?   Extraocular Movements: Extraocular movements  intact.  ?   Conjunctiva/sclera: Conjunctivae normal.  ?   Pupils: Pupils are equal, round, and reactive to light.  ?Neck:  ?   Vascular: No carotid bruit.  ?Cardiovascular:  ?   Rate and Rhythm: Normal rate and regular rhythm.  ?   Pulses: Normal pulses.  ?   Heart sounds: Normal heart sounds. No murmur heard. ?  No friction rub. No gallop.  ?Pulmonary:  ?   Effort: Pulmonary effort is normal. No respiratory distress.  ?   Breath sounds: Normal breath sounds. No stridor. No wheezing, rhonchi or rales.  ?Chest:  ?   Chest wall: No tenderness.  ?Abdominal:  ?   General: There is no distension.  ?   Palpations: Abdomen is soft. There is no mass.  ?   Tenderness: There is no abdominal tenderness. There is no right CVA tenderness, guarding or rebound.  ?   Hernia: No hernia is present.  ?Musculoskeletal:     ?   General: No swelling, tenderness, deformity or signs of injury. Normal range of motion.  ?   Cervical back: Normal range of motion and neck supple. No rigidity or tenderness.  ?   Right lower leg: No edema.  ?   Left lower leg: No edema.  ?Lymphadenopathy:  ?   Cervical: No cervical adenopathy.  ?Skin: ?   General: Skin is warm and dry.  ?   Capillary Refill: Capillary refill takes less than 2 seconds.  ?   Coloration: Skin is not jaundiced or pale.  ?   Findings: No bruising, erythema, lesion or rash.  ?   Comments: Skin tear on left fore arm , clean and dry , no redness, drainage, swelling   ?Neurological:  ?   General: No focal deficit present.  ?   Mental Status: He is alert and oriented to person, place, and time.  ?   Cranial Nerves: No cranial nerve deficit.  ?   Sensory: No sensory deficit.  ?   Motor: No weakness.  ?   Coordination: Coordination normal.  ?   Gait: Gait normal.  ?   Deep Tendon Reflexes: Reflexes normal.  ?Psychiatric:     ?   Mood and Affect: Mood normal.     ?   Behavior: Behavior normal.     ?   Thought Content: Thought content normal.     ?   Judgment: Judgment normal.  ?  ? ?No  results found for any visits on 06/15/21. ? ?   ?  Assessment & Plan:  ?  ?Routine Health Maintenance and Physical Exam ? ?Immunization History  ?Administered Date(s) Administered  ? Moderna Sars-Covid-2 Vaccination 10/07/2019, 11/02/2019  ? Pneumococcal Conjugate-13 12/05/2017  ? Pneumococcal Polysaccharide-23 06/14/2020  ? Tdap 11/17/2015  ? ? ?Health Maintenance  ?Topic Date Due  ? Zoster Vaccines- Shingrix (1 of 2) Never done  ? COVID-19 Vaccine (3 - Moderna risk series) 11/30/2019  ? Hepatitis C Screening  06/16/2022 (Originally [birth year]/1970)  ? INFLUENZA VACCINE  09/04/2021  ? Fecal DNA (Cologuard)  12/21/2023  ? TETANUS/TDAP  11/16/2025  ? Pneumonia Vaccine 75+ Years old  Completed  ? HPV VACCINES  Aged Out  ? ? ?Discussed health benefits of physical activity, and encouraged him to engage in regular exercise appropriate for his age and condition. ? ?Problem List Items Addressed This Visit   ? ?  ? Musculoskeletal and Integument  ? Skin tear of left upper arm without complication  ?  No sign of infection noted, keep site clean and dry to promote healing  ? ?  ?  ?  ? Other  ? Annual physical exam - Primary  ?  Annual exam as documented.  ?Counseling done include healthy lifestyle involving committing to 150 minutes of exercise per week, heart healthy diet, and attaining healthy weight. The importance of adequate sleep also discussed.  ?Regular use of seat belt and home safety were also discussed . ?Immunization and cancer screening  needs are specifically addressed at this visit.   ? ?  ?  ? Relevant Orders  ? HgB A1c  ? Lipid Profile  ? CBC with Differential  ? TSH  ? CMP14+EGFR  ? Vitamin D (25 hydroxy)  ? 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  Last eye exam over 10 years  ?Wears prescription glasses ?referral to opthalmology today  ? ?  ?  ? Relevant Orders  ? Ambulatory referral to Ophthalmology  ? ?Return in about 1 year (around 06/16/2022) for annual exam . ? ?  ? ?Renee Rival, FNP ? ? ?

## 2021-06-15 NOTE — Assessment & Plan Note (Signed)
No sign of infection noted, keep site clean and dry to promote healing  ?

## 2021-06-15 NOTE — Assessment & Plan Note (Signed)
Last eye exam over 10 years  ?Wears prescription glasses ?referral to opthalmology today  ?

## 2021-06-15 NOTE — Assessment & Plan Note (Signed)
Annual exam as documented.  ?Counseling done include healthy lifestyle involving committing to 150 minutes of exercise per week, heart healthy diet, and attaining healthy weight. The importance of adequate sleep also discussed.  ?Regular use of seat belt and home safety were also discussed . ?Immunization and cancer screening  needs are specifically addressed at this visit.   ?

## 2021-06-20 ENCOUNTER — Other Ambulatory Visit: Payer: Self-pay | Admitting: Nurse Practitioner

## 2021-06-20 DIAGNOSIS — E782 Mixed hyperlipidemia: Secondary | ICD-10-CM

## 2021-06-20 LAB — CMP14+EGFR
ALT: 13 IU/L (ref 0–44)
AST: 13 IU/L (ref 0–40)
Albumin/Globulin Ratio: 1.9 (ref 1.2–2.2)
Albumin: 4.2 g/dL (ref 3.8–4.8)
Alkaline Phosphatase: 62 IU/L (ref 44–121)
BUN/Creatinine Ratio: 11 (ref 10–24)
BUN: 12 mg/dL (ref 8–27)
Bilirubin Total: 0.5 mg/dL (ref 0.0–1.2)
CO2: 25 mmol/L (ref 20–29)
Calcium: 9 mg/dL (ref 8.6–10.2)
Chloride: 104 mmol/L (ref 96–106)
Creatinine, Ser: 1.12 mg/dL (ref 0.76–1.27)
Globulin, Total: 2.2 g/dL (ref 1.5–4.5)
Glucose: 89 mg/dL (ref 70–99)
Potassium: 4.3 mmol/L (ref 3.5–5.2)
Sodium: 141 mmol/L (ref 134–144)
Total Protein: 6.4 g/dL (ref 6.0–8.5)
eGFR: 72 mL/min/{1.73_m2} (ref >59)

## 2021-06-20 LAB — CBC WITH DIFFERENTIAL/PLATELET
Basophils Absolute: 0.1 10*3/uL (ref 0.0–0.2)
Basos: 1 %
EOS (ABSOLUTE): 0.1 10*3/uL (ref 0.0–0.4)
Eos: 1 %
Hematocrit: 40.7 % (ref 37.5–51.0)
Hemoglobin: 13.7 g/dL (ref 13.0–17.7)
Immature Grans (Abs): 0 10*3/uL (ref 0.0–0.1)
Immature Granulocytes: 0 %
Lymphocytes Absolute: 1.7 10*3/uL (ref 0.7–3.1)
Lymphs: 27 %
MCH: 29.6 pg (ref 26.6–33.0)
MCHC: 33.7 g/dL (ref 31.5–35.7)
MCV: 88 fL (ref 79–97)
Monocytes Absolute: 0.4 10*3/uL (ref 0.1–0.9)
Monocytes: 7 %
Neutrophils Absolute: 3.9 10*3/uL (ref 1.4–7.0)
Neutrophils: 64 %
Platelets: 177 10*3/uL (ref 150–450)
RBC: 4.63 x10E6/uL (ref 4.14–5.80)
RDW: 13.1 % (ref 11.6–15.4)
WBC: 6.1 10*3/uL (ref 3.4–10.8)

## 2021-06-20 LAB — LIPID PANEL
Chol/HDL Ratio: 6.5 ratio — ABNORMAL HIGH (ref 0.0–5.0)
Cholesterol, Total: 240 mg/dL — ABNORMAL HIGH (ref 100–199)
HDL: 37 mg/dL — ABNORMAL LOW (ref >39)
LDL Chol Calc (NIH): 164 mg/dL — ABNORMAL HIGH (ref 0–99)
Triglycerides: 209 mg/dL — ABNORMAL HIGH (ref 0–149)
VLDL Cholesterol Cal: 39 mg/dL (ref 5–40)

## 2021-06-20 LAB — HEMOGLOBIN A1C
Est. average glucose Bld gHb Est-mCnc: 120 mg/dL
Hgb A1c MFr Bld: 5.8 % — ABNORMAL HIGH (ref 4.8–5.6)

## 2021-06-20 LAB — TSH: TSH: 1.96 u[IU]/mL (ref 0.450–4.500)

## 2021-06-20 LAB — VITAMIN D 25 HYDROXY (VIT D DEFICIENCY, FRACTURES): Vit D, 25-Hydroxy: 39.5 ng/mL (ref 30.0–100.0)

## 2021-06-20 NOTE — Progress Notes (Signed)
Patient is prediabetes avoid sugar sweets soda ? ?Hyperlipidemia, please schedule a follow-up with labs in 3 months patient should have labs done 3 to 5 days before that visit ?Eat a healthy diet, including lots of fruits and vegetables. ?Avoid foods with a lot of saturated and trans fats, such as red meat, butter, fried foods and cheese . ?Maintain a healthy weight. ?If labs remain elevated at that visit we will consider treatment with a statin to help reduce risk of MI, stroke ?Fasting labs to be done 3 to 5 days before next visit in 3 months ? ?Other labs are normal. ? ? ? ? ? ? ?he 10-year ASCVD risk score (Arnett DK, et al., 2019) is: 15.4% ?  Values used to calculate the score: ?    Age: 71 years ?    Sex: Male ?    Is Non-Hispanic African American: No ?    Diabetic: No ?    Tobacco smoker: No ?    Systolic Blood Pressure: 923 mmHg ?    Is BP treated: No ?    HDL Cholesterol: 37 mg/dL ?    Total Cholesterol: 240 mg/dL ?

## 2021-06-22 ENCOUNTER — Encounter: Payer: Self-pay | Admitting: Nurse Practitioner

## 2021-10-01 ENCOUNTER — Ambulatory Visit (INDEPENDENT_AMBULATORY_CARE_PROVIDER_SITE_OTHER): Payer: Medicare Other

## 2021-10-01 DIAGNOSIS — F4321 Adjustment disorder with depressed mood: Secondary | ICD-10-CM

## 2021-10-01 DIAGNOSIS — Z0000 Encounter for general adult medical examination without abnormal findings: Secondary | ICD-10-CM | POA: Diagnosis not present

## 2021-10-01 NOTE — Addendum Note (Signed)
Addended by: Eual Fines on: 10/01/2021 12:20 PM   Modules accepted: Orders

## 2021-10-01 NOTE — Patient Instructions (Addendum)
  Russell Mckinney , Thank you for taking time to come for your Medicare Wellness Visit. I appreciate your ongoing commitment to your health goals. Please review the following plan we discussed and let me know if I can assist you in the future.   These are the goals we discussed:  Goals      Exercise 2x per week (30 min per time)     Patient Stated     Get flu vaccine at [place] shingrix vaccine at pharmacy         This is a list of the screening recommended for you and due dates:  Health Maintenance  Topic Date Due   Zoster (Shingles) Vaccine (1 of 2) Never done   COVID-19 Vaccine (3 - Moderna risk series) 11/30/2019   Flu Shot  09/04/2021   Hepatitis C Screening: USPSTF Recommendation to screen - Ages 18-79 yo.  06/16/2022*   Cologuard (Stool DNA test)  12/21/2023   Tetanus Vaccine  11/16/2025   Pneumonia Vaccine  Completed   HPV Vaccine  Aged Out  *Topic was postponed. The date shown is not the original due date.

## 2021-10-01 NOTE — Progress Notes (Signed)
I connected with  Cherre Huger on 10/01/21 by a audio enabled telemedicine application and verified that I am speaking with the correct person using two identifiers.  Patient Location: Home  Provider Location: Office/Clinic  I discussed the limitations of evaluation and management by telemedicine. The patient expressed understanding and agreed to proceed.  Subjective:   Russell Mckinney is a 71 y.o. male who presents for Medicare Annual/Subsequent preventive examination.  Review of Systems     Cardiac Risk Factors include: male gender;sedentary lifestyle;dyslipidemia;advanced age (>84mn, >>36women)     Objective:    There were no vitals filed for this visit. There is no height or weight on file to calculate BMI.     09/29/2020    3:05 PM 12/14/2017   11:44 PM 07/28/2015    5:01 PM  Advanced Directives  Does Patient Have a Medical Advance Directive? No No No  Would patient like information on creating a medical advance directive? No - Patient declined      Current Medications (verified) Outpatient Encounter Medications as of 10/01/2021  Medication Sig   EPINEPHrine 0.3 mg/0.3 mL IJ SOAJ injection Inject 0.3 mg into the skin as directed.   No facility-administered encounter medications on file as of 10/01/2021.    Allergies (verified) Codeine   History: Past Medical History:  Diagnosis Date   Diverticulitis    2017   High cholesterol    High cholesterol    Past Surgical History:  Procedure Laterality Date   TONSILLECTOMY     Family History  Problem Relation Age of Onset   Alzheimer's disease Mother    Diabetes Brother    Social History   Socioeconomic History   Marital status: Married    Spouse name: CMarlowe Kays   Number of children: 0   Years of education: Not on file   Highest education level: Associate degree: occupational, tHotel manager or vocational program  Occupational History   Not on file  Tobacco Use   Smoking status: Never   Smokeless tobacco: Never   Vaping Use   Vaping Use: Never used  Substance and Sexual Activity   Alcohol use: No   Drug use: No   Sexual activity: Not Currently    Birth control/protection: None  Other Topics Concern   Not on file  Social History Narrative   Lives home alone. Goes to the church three times a week.       Social Determinants of Health   Financial Resource Strain: Low Risk  (09/29/2020)   Overall Financial Resource Strain (CARDIA)    Difficulty of Paying Living Expenses: Not hard at all  Food Insecurity: No Food Insecurity (09/29/2020)   Hunger Vital Sign    Worried About Running Out of Food in the Last Year: Never true    Ran Out of Food in the Last Year: Never true  Transportation Needs: No Transportation Needs (09/29/2020)   PRAPARE - THydrologist(Medical): No    Lack of Transportation (Non-Medical): No  Physical Activity: Insufficiently Active (10/01/2021)   Exercise Vital Sign    Days of Exercise per Week: 1 day    Minutes of Exercise per Session: 40 min  Stress: No Stress Concern Present (09/29/2020)   FL[place]   Feeling of Stress : Not at all  Social Connections: Moderately Isolated (09/29/2020)   Social Connection and Isolation Panel [NHANES]    Frequency of Communication with Friends and Family: Twice  a week    Frequency of Social Gatherings with Friends and Family: Three times a week    Attends Religious Services: More than 4 times per year    Active Member of Clubs or Organizations: No    Attends Archivist Meetings: Never    Marital Status: Widowed    Tobacco Counseling Counseling given: Not Answered   Clinical Intake:  Pre-visit preparation completed: Yes  Pain : No/denies pain     Nutritional Status: BMI of 19-24  Normal Diabetes: No  How often do you need to have someone help you when you read instructions, pamphlets, or other written materials from your  doctor or pharmacy?: 1 - Never  [place] of Daily Living    10/01/2021   12:03 PM  In your present state of health, do you have any difficulty performing the following activities:  Hearing? 0  Vision? 0  Difficulty concentrating or making decisions? 0  Walking or climbing stairs? 0  Dressing or bathing? 0  Doing errands, shopping? 0  Preparing Food and eating ? N  Using the Toilet? N  In the past six months, have you accidently leaked urine? N  Do you have problems with loss of bowel control? N  Managing your Medications? N  Managing your Finances? N  Housekeeping or managing your Housekeeping? N    Patient Care Team: Renee Rival, FNP as PCP - General (Nurse Practitioner)  Indicate any recent Medical Services you may have received from other than [facility] providers in the past year (date may be approximate).     Assessment:   This is a routine wellness examination for Russell Mckinney.  Hearing/Vision screen No results found.  Dietary issues and exercise activities discussed: Current Exercise Habits: The patient has a physically strenuous job, but has no regular exercise apart from work.   Goals Addressed   None    Depression Screen    06/15/2021    8:12 AM 09/29/2020    3:07 PM 09/29/2020    3:03 PM 09/13/2020    9:12 AM 08/03/2020   10:08 AM 06/14/2020    2:09 PM 02/11/2019   11:45 AM  PHQ 2/9 Scores  PHQ - 2 Score '1 4 4 1 1 2 '$ 0  PHQ- 9 Score  '4 4  1 3     '$ Fall Risk    10/01/2021   12:03 PM 06/15/2021    8:11 AM 09/29/2020    3:06 PM 09/13/2020    9:12 AM 08/03/2020   10:08 AM  Fall Risk   Falls in the past year? 0 0 0 0 0  Number falls in past yr: 0 0 0 0 0  Injury with Fall? 0 0 0 0 0  Risk for fall due to :  No Fall Risks No Fall Risks No Fall Risks No Fall Risks  Follow up  Falls evaluation completed Falls evaluation completed Falls evaluation completed Falls evaluation completed    FALL RISK PREVENTION PERTAINING TO THE HOME:  Any  stairs in or around the home? No  If so, are there any without handrails? No  Home free of loose throw rugs in walkways, pet beds, electrical cords, etc? Yes  Adequate lighting in your home to reduce risk of falls? Yes   ASSISTIVE DEVICES UTILIZED TO PREVENT FALLS:  Life alert? No  Use of a cane, walker or w/c? No  Grab bars in the bathroom? Yes  Shower chair or  bench in shower? No  Elevated toilet seat or a handicapped toilet? No    Cognitive Function:    12/18/2018   10:44 AM  MMSE - Mini Mental State Exam  Orientation to time 5  Orientation to Place 5  Registration 3  Attention/ Calculation 5  Recall 3  Language- name 2 objects 2  Language- repeat 1  Language- follow 3 step command 3  Language- read & follow direction 1  Write a sentence 1  Copy design 1  Total score 30        10/01/2021   12:05 PM 09/29/2020    3:13 PM  6CIT Screen  What Year? 0 points 0 points  What month? 0 points 0 points  What time? 0 points 0 points  Count back from 20 0 points 0 points  Months in reverse 0 points 0 points  Repeat phrase 0 points 2 points  Total Score 0 points 2 points    Immunizations Immunization History  Administered Date(s) Administered   Moderna Sars-Covid-2 Vaccination 10/07/2019, 11/02/2019   Pneumococcal Conjugate-13 12/05/2017   Pneumococcal Polysaccharide-23 06/14/2020   Tdap 11/17/2015    TDAP status: Up to date  Flu Vaccine status: Due, Education has been provided regarding the importance of this vaccine. Advised may receive this vaccine at local pharmacy or Health Dept. Aware to provide a copy of the vaccination record if obtained from local pharmacy or Health Dept. Verbalized acceptance and understanding.  Pneumococcal vaccine status: Up to date  Covid-19 vaccine status: Completed vaccines  Qualifies for Shingles Vaccine? Yes   Zostavax completed No   Shingrix Completed?: No.    Education has been provided regarding the importance of this vaccine.  Patient has been advised to call insurance company to determine out of pocket expense if they have not yet received this vaccine. Advised may also receive vaccine at local pharmacy or Health Dept. Verbalized acceptance and understanding.  Screening Tests Health Maintenance  Topic Date Due   Zoster Vaccines- Shingrix (1 of 2) Never done   COVID-19 Vaccine (3 - Moderna risk series) 11/30/2019   INFLUENZA VACCINE  09/04/2021   Hepatitis C Screening  06/16/2022 (Originally 11/17/1970)   Fecal DNA (Cologuard)  12/21/2023   TETANUS/TDAP  11/16/2025   Pneumonia Vaccine 52+ Years old  Completed   HPV VACCINES  Aged Out    Health Maintenance  Health Maintenance Due  Topic Date Due   Zoster Vaccines- Shingrix (1 of 2) Never done   COVID-19 Vaccine (3 - Moderna risk series) 11/30/2019   INFLUENZA VACCINE  09/04/2021    Colorectal cancer screening: Type of screening: Cologuard. Completed 2022. Repeat every 3 years  Lung Cancer Screening: (Low Dose CT Chest recommended if Age 40-80 years, 30 pack-year currently smoking OR have quit w/in 15years.) does not qualify.   Lung Cancer Screening Referral: na  Additional Screening:  Hepatitis C Screening: does not qualify; Completed   Vision Screening: Recommended annual ophthalmology exams for early detection of glaucoma and other disorders of the eye. Is the patient up to date with their annual eye exam?  No  Who is the provider or what is the name of the office in which the patient attends annual eye exams? Referred to dr cotter  If pt is not established with a provider, would they like to be referred to a provider to establish care? Yes .   Dental Screening: Recommended annual dental exams for proper oral hygiene  Community Resource Referral / Chronic Care Management: CRR  required this visit?  No   CCM required this visit?  No      Plan:     I have personally reviewed and noted the following in the patient's chart:   Medical and  social history Use of alcohol, tobacco or illicit drugs  Current medications and supplements including opioid prescriptions. Patient is not currently taking opioid prescriptions. Functional ability and status Nutritional status Physical activity Advanced directives List of other physicians Hospitalizations, surgeries, and ER visits in previous 12 months Vitals Screenings to include cognitive, depression, and falls Referrals and appointments  In addition, I have reviewed and discussed with patient certain preventive protocols, quality metrics, and best practice recommendations. A written personalized care plan for preventive services as well as general preventive health recommendations were provided to patient.     Eual Fines, LPN   0/10/3816   Nurse Notes: Schedule your next wellness visit for 1 year at checkout

## 2021-10-16 ENCOUNTER — Telehealth: Payer: Self-pay

## 2021-10-16 NOTE — Chronic Care Management (AMB) (Signed)
  Chronic Care Management   Outreach Note  10/16/2021 Name: Russell Mckinney MRN: [id number] DOB: 1950-06-27  Russell Mckinney is a 71 y.o. year old male who is a primary care patient of Russell Rival, FNP. I reached out to Russell Mckinney by phone today in response to a referral sent by Russell Mckinney primary care provider.  A second unsuccessful telephone outreach was attempted today. The patient was referred to the case management team for assistance with care management and care coordination.   Follow Up Plan: Patient states that he is out of town call back in 12 days.The care management team will reach out to the patient again over the next 12 days.  If patient returns call to provider office, please advise to call [place] * at [phone]*  Noreene Larsson, [place], [place] [address] Direct Dial: [phone] [email or web address]

## 2021-11-05 NOTE — Chronic Care Management (AMB) (Signed)
  Care Coordination   Note   11/05/2021 Name: Russell Mckinney MRN: [id number] DOB: 05-20-1950  Russell Mckinney is a 71 y.o. year old male who sees [facility], Dewaine Conger, FNP for primary care. I reached out to Cherre Huger by phone today to offer care coordination services.  [name] was given information about Care Coordination services today including:   The Care Coordination services include support from the care team which includes your Nurse Coordinator, Clinical Social Worker, or Pharmacist.  The Care Coordination team is here to help remove barriers to the health concerns and goals most important to you. Care Coordination services are voluntary, and the patient may decline or stop services at any time by request to their care team member.   Care Coordination Consent Status: Patient agreed to services and verbal consent obtained.   Follow up plan:  Telephone appointment with care coordination team member scheduled for:  11/26/2021  Encounter Outcome:  Pt. Scheduled  Noreene Larsson, [place], [place] [address] Direct Dial: [phone] [email or web address]

## 2021-11-26 ENCOUNTER — Encounter: Payer: Self-pay | Admitting: *Deleted

## 2021-11-26 ENCOUNTER — Ambulatory Visit: Payer: Self-pay | Admitting: *Deleted

## 2021-11-26 NOTE — Patient Outreach (Signed)
  Care Coordination   Initial Visit Note   11/26/2021  Name: Russell Mckinney MRN: [id number] DOB: [birth year]  Russell Mckinney is a 71 y.o. year old male who sees [place], Dewaine Conger, FNP for primary care. I spoke with Russell Mckinney by phone today.  What matters to the patients health and wellness today?   Reduce and Manage Symptoms of Depression and Grief.   Goals Addressed               This Visit's Progress     Reduce and Manage Symptoms of Depression and Grief. (pt-stated)   On track     Care Coordination Interventions:  Assessed Social Determinant of Health Barriers. Discussed Plans with Patient for Ongoing Care Management Follow Up. Provided Patient with Direct Contact Information for Care Management Team. Screened Patient for Signs & Symptoms of Depression Related to Chronic Disease State.  EHU3/JSH7 Depression Screen Completed & Results Reviewed with Patient.  Suicidal Ideation/Homicidal Ideation Assessed - None Present. Solution-Focused Strategies Employed. Deep Breathing Exercises, Relaxation Techniques & Mindfulness Meditation Strategies Taught & Encouraged Daily.   Active Listening/Reflection Utilized.  Emotional Support Provided. Verbalization of Feelings Encouraged.  Problem Solving/Task-Centered Solutions Developed.   Psychoeducation for Mental Health Concerns Initiated. Reviewed Mental Health Medications & Discussed Importance of Compliance. Quality of Sleep Assessed & Sleep Hygiene Techniques Promoted. Participation in Counseling Encouraged. Discussed Referral to Psychiatrist for Psychotropic Medication Management. Discussed Referral to Therapist for Psychotherapeutic Counseling Services.         SDOH assessments and interventions completed:  Yes.  SDOH Interventions Today    Flowsheet Row Most Recent Value  SDOH Interventions   Food Insecurity Interventions Intervention Not Indicated  Housing Interventions Intervention Not Indicated  Transportation  Interventions Intervention Not Indicated  Utilities Interventions Intervention Not Indicated  Alcohol Usage Interventions Intervention Not Indicated (Score <7)  Financial Strain Interventions Intervention Not Indicated  Physical Activity Interventions Intervention Not Indicated  Stress Interventions Intervention Not Indicated  Social Connections Interventions Intervention Not Indicated     Care Coordination Interventions Activated:  Yes.    Care Coordination Interventions:  Yes, provided.    Follow up plan: Follow up outreach call scheduled on 12/10/2021 at 10:00 am.   Encounter Outcome:  Pt. Visit Completed.    Russell Mckinney, BSW, MSW, LCSW  Licensed Education officer, environmental Health System  Mailing [place] N. [address] [place], [place] [address] Physical Address-[address], [place], [place] [address] Toll Free Main # [phone] Fax # [phone] Cell # [phone] Di [email or web address]

## 2021-11-26 NOTE — Patient Instructions (Addendum)
Visit Information  Thank you for taking time to visit with me today. Please don't hesitate to contact me if I can be of assistance to you.   Following are the goals we discussed today:   Goals Addressed               This Visit's Progress     Reduce and Manage Symptoms of Depression and Grief. (pt-stated)   On track     Care Coordination Interventions:  Assessed Social Determinant of Health Barriers. Discussed Plans with Patient for Ongoing Care Management Follow Up. Provided Patient with Direct Contact Information for Care Management Team. Screened Patient for Signs & Symptoms of Depression Related to Chronic Disease State.  LKG4/WNU2 Depression Screen Completed & Results Reviewed with Patient.  Suicidal Ideation/Homicidal Ideation Assessed - None Present. Solution-Focused Strategies Employed. Deep Breathing Exercises, Relaxation Techniques & Mindfulness Meditation Strategies Taught & Encouraged Daily.   Active Listening/Reflection Utilized.  Emotional Support Provided. Verbalization of Feelings Encouraged.  Problem Solving/Task-Centered Solutions Developed.   Psychoeducation for Mental Health Concerns Initiated. Reviewed Mental Health Medications & Discussed Importance of Compliance. Quality of Sleep Assessed & Sleep Hygiene Techniques Promoted. Participation in Counseling Encouraged. Discussed Referral to Psychiatrist for Psychotropic Medication Management. Discussed Referral to Therapist for Psychotherapeutic Counseling Services.         Our next appointment is by telephone on 12/10/2021 at 10:00 am.  Please call the care guide team at [phone] if you need to cancel or reschedule your appointment.   If you are experiencing a Mental Health or [place] or need someone to talk to, please call the Suicide and Crisis Lifeline: 988 call the [place] National Suicide Prevention Lifeline: (682)681-2372 or TTY: (581)561-1593 TTY 2155831862) to talk to a  trained counselor call 1-800-273-TALK (toll free, 24 hour hotline) go to [facility] [address] [place] ([phone]) call the [place]: [phone] call 911  Patient verbalizes understanding of instructions and care plan provided today and agrees to view in [place]. Active MyChart status and patient understanding of how to access instructions and care plan via MyChart confirmed with patient.     Telephone follow up appointment with care management team member scheduled for:  12/10/2021 at 10:00 am.  Nat Christen, BSW, MSW, [place]  Licensed Clinical Social Worker  [place]  Mailing [place]. [address] [place], [place] [address] Physical Address-[address], [place], [place] [address] Toll Free Main # [phone] Fax # [phone] Cell # [phone] Di [email or web address]

## 2021-12-10 ENCOUNTER — Ambulatory Visit: Payer: Self-pay | Admitting: *Deleted

## 2021-12-10 NOTE — Patient Outreach (Signed)
  Care Coordination   12/10/2021  Name: Calieb Lichtman MRN: [id number] DOB: 06-11-1950   Care Coordination Outreach Attempts:  An unsuccessful telephone outreach was attempted today to offer the patient information about available care coordination services as a benefit of their health plan. HIPAA compliant messages left on voicemail, providing contact information for CSW, encouraging patient to return CSW's call at his earliest convenience.   Follow Up Plan:  Additional outreach attempts will be made to offer the patient care coordination information and services.    Encounter Outcome:  No Answer.    Care Coordination Interventions Activated:  No.     Care Coordination Interventions:  No, not indicated.     Nat Christen, BSW, MSW, LCSW  Licensed Education officer, environmental Health System  Mailing [place] [address]. [address], [place], [place] [address] Physical Address-[address] [place], [place] [address] Toll Free Main # [phone] Fax # [phone] Cell # [phone] Di [email or web address]

## 2022-01-01 ENCOUNTER — Telehealth: Payer: Self-pay | Admitting: *Deleted

## 2022-01-01 NOTE — Progress Notes (Signed)
  Care Coordination Note  01/01/2022 Name: Russell Mckinney MRN: [id number] DOB: 04-04-1950  Russell Mckinney is a 71 y.o. year old male who is a primary care patient of Alvira Monday, [place] and is actively engaged with the care management team. I reached out to Cherre Huger by phone today to assist with re-scheduling a follow up visit with the Licensed Clinical Social Worker  Follow up plan: Telephone appointment with care management team member scheduled for:01/09/22  [place]  Direct Dial: [phone]

## 2022-01-01 NOTE — Progress Notes (Signed)
  Care Coordination Note  01/01/2022 Name: Zyshonne Malecha MRN: [id number] DOB: 1950-03-07  Jason Frisbee is a 71 y.o. year old male who is a primary care patient of Renee Rival, FNP and is actively engaged with the care management team. I reached out to Cherre Huger by phone today to assist with re-scheduling a follow up visit with the Licensed Clinical Social Worker  Follow up plan: Unsuccessful telephone outreach attempt made. A HIPAA compliant phone message was left for the patient providing contact information and requesting a return call.   [place]  Direct Dial: [phone]

## 2022-01-09 ENCOUNTER — Ambulatory Visit: Payer: Self-pay | Admitting: *Deleted

## 2022-01-09 ENCOUNTER — Encounter: Payer: Self-pay | Admitting: *Deleted

## 2022-01-09 NOTE — Patient Outreach (Signed)
  Care Coordination   Follow Up Visit Note   01/09/2022  Name: Russell Mckinney MRN: [id number] DOB: [birth year]  Russell Mckinney is a 71 y.o. year old male who sees Alvira Monday, [place] for primary care. I spoke with Cherre Huger by phone today.  What matters to the patients health and wellness today?   Reduce and Manage Symptoms of Depression and Grief.   Goals Addressed               This Visit's Progress     Reduce and Manage Symptoms of Depression and Grief. (pt-stated)   On track     Care Coordination Interventions:  Deep Breathing Exercises, Relaxation Techniques & Mindfulness Meditation Strategies Reviewed & Encouraged Daily.   Active Listening/Reflection Utilized.  Emotional Support Provided. Verbalization of Feelings Encouraged.  Problem Solving Interventions Activated. Task-Centered Solutions Employed.   Participation in Counseling Emphasized. Please Review the Following List of Counseling Agencies and Resources, Emailed on 01/09/2022: ~ Counseling Agencies in [place] ~ [place] ~ Marriage, Relationship, Individual and Family Counseling Services Be Prepared to [employer] and Resources of Interest, From the List Provided, and Receive Assistance with Referral Process. Continue to Consider Referral to Psychiatrist for Psychotropic Medication Management. Continue to Consider Referral to Therapist for Psychotherapeutic Counseling Services.       SDOH assessments and interventions completed:  Yes.  Care Coordination Interventions:  Yes, provided.   Follow up plan: Follow up call scheduled for 01/23/2022 at 10:30 am.   Encounter Outcome:  Pt. Visit Completed.   Nat Christen, BSW, MSW, LCSW  Licensed Education officer, environmental Health System  Mailing [place] [address]. [address], [place], [place] [address] Physical Address-[address] [place], [place] [address] Toll Free Main  # [phone] Fax # [phone] Cell # [phone] Di [email or web address]

## 2022-01-09 NOTE — Patient Instructions (Signed)
Visit Information  Thank you for taking time to visit with me today. Please don't hesitate to contact me if I can be of assistance to you.   Following are the goals we discussed today:   Goals Addressed               This Visit's Progress     Reduce and Manage Symptoms of Depression and Grief. (pt-stated)   On track     Care Coordination Interventions:  Deep Breathing Exercises, Relaxation Techniques & Mindfulness Meditation Strategies Reviewed & Encouraged Daily.   Active Listening/Reflection Utilized.  Emotional Support Provided. Verbalization of Feelings Encouraged.  Problem Solving Interventions Activated. Task-Centered Solutions Employed.   Participation in Counseling Emphasized. Please Review the Following List of Counseling Agencies and Resources, Emailed on 01/09/2022: ~ Counseling Agencies in [place] ~ [place] ~ Marriage, Relationship, Individual and Family Counseling Services Be Prepared to [employer] and Resources of Interest, From the List Provided, and Receive Assistance with Referral Process. Continue to Consider Referral to Psychiatrist for Psychotropic Medication Management. Continue to Consider Referral to Therapist for Psychotherapeutic Counseling Services.       Our next appointment is by telephone on 01/23/2022 at 10:30 am.  Please call the care guide team at [phone] if you need to cancel or reschedule your appointment.   If you are experiencing a Mental Health or [place] or need someone to talk to, please call the Suicide and Crisis Lifeline: 988 call the [place] National Suicide Prevention Lifeline: 647 037 5613 or TTY: (904) 856-4578 TTY 9795478706) to talk to a trained counselor call 1-800-273-TALK (toll free, 24 hour hotline) go to [facility] [address] [place] [phone]) call the [place]:  [phone] call 911  Patient verbalizes understanding of instructions and care plan provided today and agrees to view in [place]. Active MyChart status and patient understanding of how to access instructions and care plan via MyChart confirmed with patient.     Telephone follow up appointment with care management team member scheduled for:  01/23/2022 at 10:30 am.  Nat Christen, BSW, MSW, [place]  Licensed Clinical Social Worker  [place]  Mailing [place]. [address] [place], [place] [address] Physical Address-[address], [place], [place] [address] Toll Free Main # [phone] Fax # [phone] Cell # [phone] Di [email or web address]

## 2022-01-23 ENCOUNTER — Encounter: Payer: Self-pay | Admitting: *Deleted

## 2022-01-23 ENCOUNTER — Ambulatory Visit: Payer: Self-pay | Admitting: *Deleted

## 2022-01-23 NOTE — Patient Outreach (Signed)
  Care Coordination   Follow Up Visit Note   01/23/2022  Name: Russell Mckinney MRN: [id number] DOB: [birth year]  Russell Mckinney is a 71 y.o. year old male who sees Russell Mckinney, [place] for primary care. I spoke with Russell Mckinney by phone today.  What matters to the patients health and wellness today?  Reduce and Manage Symptoms of Depression and Grief.   Goals Addressed               This Visit's Progress     Reduce and Manage Symptoms of Depression and Grief. (pt-stated)   On track     Care Coordination Interventions:  Deep Breathing Exercises, Relaxation Techniques & Mindfulness Meditation Strategies Encouraged Daily.   Active Listening/Reflection Utilized.  Verbalization of Feelings Encouraged.  Emotional and Grief Support Provided. Problem Solving Interventions Activated. Task-Centered Solutions Employed.   Solution-Focused Strategies Developed. Participation in Counseling Emphasized. Please Review the Following List of Counseling Agencies and Resources, Emailed on 01/09/2022, and Again on 01/23/2022, Due to Non-Receipt: ~ Counseling Agencies in [place] ~ [place] ~ Marriage, Relationship, Individual and Family Counseling Services Be Prepared to [employer] and Resources of Interest, From the List Provided, and Receive Assistance with Referral Process. Continue to Consider Referral to Psychiatrist for Psychotropic Medication Management. Continue to Consider Referral to Therapist for Psychotherapeutic Counseling Services. Continue to [facility] 3-4 Days Per Week with "[employer]:  Helping Men Unlock Their God Given Potential" through [employer] Seek Grief and Loss Support through [place] at [facility] [phone]), Every Tuesday Evening at 6:30 PM, In the [place].  Please Review the Following List of Sex Therapists, Emailed on 01/23/2022, and Consider Self-Enrollment:  ~  [place] [phone])  ~ Verdene Lennert, Licensed Marriage & Family Therapist (# [phone])  ~ [place] (# [phone])  ~ Wilnette Kales, [place] Counselor [phone])  ~ Nelida Meuse [place]od[place], Licensed Professional Counselor [phone])       SDOH assessments and interventions completed:  Yes.  Care Coordination Interventions:  Yes, provided.   Follow up plan: Follow up call scheduled for 02/06/2022 at 12:00 pm.  Encounter Outcome:  Pt. Visit Completed.   Nat Christen, BSW, MSW, LCSW  Licensed Education officer, environmental Health System  Mailing [place] [address]. [address] [place], [place] [address] Physical Address-[address], [place],  [address] Toll Free Main # [phone] Fax # [phone] Cell # [phone] Di [email or web address]

## 2022-01-23 NOTE — Patient Instructions (Signed)
Visit Information  Thank you for taking time to visit with me today. Please don't hesitate to contact me if I can be of assistance to you.   Following are the goals we discussed today:   Goals Addressed               This Visit's Progress     Reduce and Manage Symptoms of Depression and Grief. (pt-stated)   On track     Care Coordination Interventions:  Deep Breathing Exercises, Relaxation Techniques & Mindfulness Meditation Strategies Encouraged Daily.   Active Listening/Reflection Utilized.  Verbalization of Feelings Encouraged.  Emotional and Grief Support Provided. Problem Solving Interventions Activated. Task-Centered Solutions Employed.   Solution-Focused Strategies Developed. Participation in Counseling Emphasized. Please Review the Following List of Counseling Agencies and Resources, Emailed on 01/09/2022, and Again on 01/23/2022, Due to Non-Receipt: ~ Counseling Agencies in [place] ~ [place] ~ Marriage, Relationship, Individual and Family Counseling Services Be Prepared to [employer] and Resources of Interest, From the List Provided, and Receive Assistance with Referral Process. Continue to Consider Referral to Psychiatrist for Psychotropic Medication Management. Continue to Consider Referral to Therapist for Psychotherapeutic Counseling Services. Continue to [facility] 3-4 Days Per Week with "[employer]:  Helping Men Unlock Their God Given Potential" through [employer] Seek Grief and Loss Support through [place] at [facility] [phone]), Every Tuesday Evening at 6:30 PM, In the [place].  Please Review the Following List of Sex Therapists, Emailed on 01/23/2022, and Consider Self-Enrollment:  ~ [place] [phone])  ~ Verdene Lennert, Licensed Marriage & Family Therapist (# [phone])  ~ [place] (#  [phone])  ~ Wilnette Kales, [place] Counselor [phone])  ~ Nelida Meuse Lodwick, Licensed Professional Counselor [phone])       Our next appointment is by telephone on 02/06/2022 at 12:00 pm.  Please call the care guide team at [phone] if you need to cancel or reschedule your appointment.   If you are experiencing a Mental Health or [place] or need someone to talk to, please call the Suicide and Crisis Lifeline: 988 call the [place] National Suicide Prevention Lifeline: (540) 811-2260 or TTY: 660 887 9258 TTY 760-344-4723) to talk to a trained counselor call 1-800-273-TALK (toll free, 24 hour hotline) go to [facility] [address] [place] [phone]) call the [place]: [phone] call 911  Patient verbalizes understanding of instructions and care plan provided today and agrees to view in [place]. Active MyChart status and patient understanding of how to access instructions and care plan via MyChart confirmed with patient.     Telephone follow up appointment with care management team member scheduled for:  02/06/2022 at 12:00 pm.  Nat Christen, BSW, MSW, [place]  Licensed Clinical Social Worker  [place]  Mailing [place]. [address], [place], [place] [address] Physical Address-[address] [place], [place] [address] Toll Free Main # [phone] Fax # [phone] Cell # [phone] Di [email or web address]

## 2022-02-06 ENCOUNTER — Ambulatory Visit: Payer: Self-pay | Admitting: *Deleted

## 2022-02-06 ENCOUNTER — Encounter: Payer: Self-pay | Admitting: *Deleted

## 2022-02-06 NOTE — Patient Outreach (Signed)
  Care Coordination   Follow Up Visit Note   02/06/2022  Name: Russell Mckinney MRN: [id number] DOB: 09-17-1950  Russell Mckinney is a 72 y.o. year old male who sees Alvira Monday, [place] for primary care. I spoke with Cherre Huger by phone today.  What matters to the patients health and wellness today?  Reduce and Manage Symptoms of Depression and Grief.    Goals Addressed               This Visit's Progress     Reduce and Manage Symptoms of Depression and Grief. (pt-stated)   On track     Care Coordination Interventions:    Active Listening/Reflection Utilized.  Verbalization of Feelings Encouraged.  Emotional & Grief Support Provided. Problem Solving Interventions Employed. Task-Centered Solutions Re-Established.   Solution-Focused Strategies Activated. Confirmed Receipt & Thoroughly Reviewed the Following List of Counseling Agencies & Resources, to Ensure Understanding: ~ Counseling Agencies in [place] ~ [place] ~ Marriage, Relationship, Individual and Family Counseling Services Confirmed Receipt & Thoroughly Reviewed the Following List of [place], to [employer] Understanding: ~ [employer] [facility] (# [phone]) ~ Verdene Lennert, Licensed Marriage & Family Therapist (# [phone]) ~ [place] (# [phone]) ~ Wilnette Kales, [place] Counselor [phone]) ~ Nelida Meuse Lod[place], Licensed Professional Counselor [phone]# [phone]) Continue to Receive Ongoing [place] Through CSW with [facility] [phone]). Continue to Consider Referral to Psychiatrist for Psychotropic Medication Management, From List Provided. Continue to [facility] 3-4 Days Per Week with "[employer]:  Helping Men Unlock Their God Given Potential" through [employer] Please Consider Attending Grief & Loss Support Group Through Sissy Hoff at [facility] [phone]), Every Tuesday Evening at 6:30 PM, In the [place].         SDOH assessments and interventions completed:  Yes.  Care Coordination Interventions:  Yes, provided.   Follow up plan: Follow up call scheduled for 02/20/2022 at 1:30 pm.  Encounter Outcome:  Pt. Visit Completed.   Nat Christen, BSW, MSW, LCSW  Licensed Education officer, environmental Health System  Mailing [place] [address]. [address] [place], [place] [address] Physical Address-[address] [place], [place] [address] Toll Free Main # [phone] Fax # [phone] Cell # [phone] Di [email or web address]

## 2022-02-06 NOTE — Patient Instructions (Signed)
Visit Information  Thank you for taking time to visit with me today. Please don't hesitate to contact me if I can be of assistance to you.   Following are the goals we discussed today:   Goals Addressed               This Visit's Progress     Reduce and Manage Symptoms of Depression and Grief. (pt-stated)   On track     Care Coordination Interventions:    Active Listening/Reflection Utilized.  Verbalization of Feelings Encouraged.  Emotional & Grief Support Provided. Problem Solving Interventions Employed. Task-Centered Solutions Re-Established.   Solution-Focused Strategies Activated. Confirmed Receipt & Thoroughly Reviewed the Following List of Counseling Agencies & Resources, to Ensure Understanding: ~ Counseling Agencies in [place] ~ [place] ~ Marriage, Relationship, Individual and Family Counseling Services Confirmed Receipt & Thoroughly Reviewed the Following List of [place], to [employer] Understanding: ~ [employer] (# [phone]) ~ Verdene Lennert, Licensed Marriage & Family Therapist (# [phone]) ~ [place] (# [phone]) ~ Wilnette Kales, [place] Counselor [phone]) ~ Nelida Meuse [place], Licensed Professional Counselor [phone]# [phone]) Continue to Receive Ongoing [place] Through CSW with [facility] [phone]). Continue to Consider Referral to Psychiatrist for Psychotropic Medication Management, From List Provided. Continue to [facility] 3-4 Days Per Week with "[employer]:  Helping Men Unlock Their God Given Potential" through [employer] Please Consider Attending Grief & Loss Support Group Through Sissy Hoff at [facility] [phone]), Every Tuesday Evening at 6:30 PM, In the [place].         Our next appointment is by telephone on  02/20/2022 at 1:30 pm.  Please call the care guide team at [phone] if you need to cancel or reschedule your appointment.   If you are experiencing a Mental Health or [place] or need someone to talk to, please call the Suicide and Crisis Lifeline: 988 call the [place] National Suicide Prevention Lifeline: 570-777-6583 or TTY: (603)248-7028 TTY 765-217-3708) to talk to a trained counselor call 1-800-273-TALK (toll free, 24 hour hotline) go to [facility] [address], [place] [phone]) call the [place]: [phone] call 911  Patient verbalizes understanding of instructions and care plan provided today and agrees to view in [place]. Active MyChart status and patient understanding of how to access instructions and care plan via MyChart confirmed with patient.     Telephone follow up appointment with care management team member scheduled for:  02/20/2022 at 1:30 pm.    Nat Christen, BSW, MSW, [place]  Licensed Clinical Social Worker  [place]  Mailing [place]. [address] [place], [place] [address] Physical Address-[address] [place], [place] [address] Toll Free Main # [phone] Fax # [phone] Cell # [phone] Di [email or web address]

## 2022-02-20 ENCOUNTER — Encounter: Payer: Self-pay | Admitting: *Deleted

## 2022-02-20 ENCOUNTER — Ambulatory Visit: Payer: Self-pay | Admitting: *Deleted

## 2022-02-20 NOTE — Patient Instructions (Signed)
Visit Information  Thank you for taking time to visit with me today. Please don't hesitate to contact me if I can be of assistance to you.   Following are the goals we discussed today:   Goals Addressed               This Visit's Progress     COMPLETED: Reduce and Manage Symptoms of Depression and Grief. (pt-stated)   On track     Care Coordination Interventions:    Problem Solving Interventions Activated. Task-Centered Solutions Implemented.   Solution-Focused Strategies Employed. Active Listening & Reflection Utilized.  Verbalization of Feelings Encouraged.  Emotional & Grief Support Provided. Grief Support Resources Reviewed. Self-Enrollment in Grief & Loss Support Group Emphasized. Please Begin Contacting Counseling Agencies & Resources of Interest, In An Effort to Establish Ongoing Psychotherapeutic Counseling Services & Psychotropic Medication Management, From the List Provided: ~ Counseling Agencies in [place] ~ [place] ~ Marriage, Relationship, Individual and Family Counseling Services ~ [place] (# [phone]) ~ Verdene Lennert, Licensed Marriage & Family Therapist (# [phone]) ~ [place] (# [phone]) ~ Wilnette Kales, Licensed Clinical [place] Counselor [phone]) ~ Nelida Meuse Lodwick, Licensed Professional Counselor [phone]) Continue to Volunteer 3-4 Days Per Week with "[employer]:  Helping Men Unlock Their God Given Potential" through [employer] Please Contact CSW Directly (# [phone]), If You Have Questions, If Additional Social Work Needs Are Identified in The Near Future, or If [place] About Wanting to [place].        Please call the care guide team at [phone] if you need to cancel or reschedule your appointment.   If you are experiencing a Mental Health or [place] or need someone to talk to, please call the Suicide and Crisis Lifeline: 988 call the [place] National Suicide Prevention Lifeline: 234-155-3904 or TTY: (458)461-4469 TTY 3075678780) to talk to a trained counselor call 1-800-273-TALK (toll free, 24 hour hotline) go to [facility] [address], [place] [phone]) call the [place]: [phone] call 911  Patient verbalizes understanding of instructions and care plan provided today and agrees to view in [place]. Active MyChart status and patient understanding of how to access instructions and care plan via MyChart confirmed with patient.     No further follow up required.  Nat Christen, BSW, MSW, LCSW  Licensed Education officer, environmental Health System  Mailing [place] [address]. [address], [place], [place] [address] Physical Address-[address], [place], [place] [address] Toll Free Main # [phone] Fax # [phone] Cell # [phone] Di [email or web address]

## 2022-02-20 NOTE — Patient Outreach (Signed)
  Care Coordination   Follow Up Visit Note   02/20/2022  Name: Marquavious Nazar MRN: [id number] DOB: 05/21/1950  Chanse Kagel is a 72 y.o. year old male who sees Alvira Monday, [place] for primary care. I spoke with Cherre Huger by phone today.  What matters to the patients health and wellness today?  Reduce and Manage Symptoms of Depression and Grief.    Goals Addressed               This Visit's Progress     COMPLETED: Reduce and Manage Symptoms of Depression and Grief. (pt-stated)   On track     Care Coordination Interventions:    Problem Solving Interventions Activated. Task-Centered Solutions Implemented.   Solution-Focused Strategies Employed. Active Listening & Reflection Utilized.  Verbalization of Feelings Encouraged.  Emotional & Grief Support Provided. Grief Support Resources Reviewed. Self-Enrollment in Grief & Loss Support Group Emphasized. Please Begin Contacting Counseling Agencies & Resources of Interest, In An Effort to Establish Ongoing Psychotherapeutic Counseling Services & Psychotropic Medication Management, From the List Provided: ~ Counseling Agencies in [place] ~ [place] ~ Marriage, Relationship, Individual and Family Counseling Services ~ [place] (# [phone]) ~ Verdene Lennert, Licensed Marriage & Family Therapist (# [phone]) ~ [place] (# [phone]) ~ Wilnette Kales, Licensed Clinical [place] Counselor [phone]) ~ Nelida Meuse Lodwick, Licensed Professional Counselor [phone]) Continue to Volunteer 3-4 Days Per Week with "[employer]:  Helping Men Unlock Their God Given Potential" through [employer]. Please Contact CSW Directly (# [phone]), If You Have Questions, If Additional Social Work Needs Are Identified in The Near Future, or If [place] About Wanting to [place].        SDOH  assessments and interventions completed:  Yes.  Care Coordination Interventions:  Yes, provided.   Follow up plan: No further intervention required.   Encounter Outcome:  Pt. Visit Completed.   Nat Christen, BSW, MSW, LCSW  Licensed Education officer, environmental Health System  Mailing [place] [address] [place], [place] [address] Physical Address-[address] [place], [place] [address] Toll Free Main # [phone] Fax # [phone] Cell # [phone] Di [email or web address]

## 2022-03-29 ENCOUNTER — Telehealth: Payer: Self-pay | Admitting: Family Medicine

## 2022-03-29 NOTE — Telephone Encounter (Signed)
Kindly ask the patient to schedule an appt

## 2022-03-29 NOTE — Telephone Encounter (Signed)
Patient came by the office and needs a refill no longer gets from other provider, he is getting married next week and needs this medicine refilled.   tadalafil (CIALIS) 20 MG tablet [id number]   Pharmacy: [facility]

## 2022-04-02 NOTE — Telephone Encounter (Signed)
scheduled

## 2022-04-05 ENCOUNTER — Ambulatory Visit (INDEPENDENT_AMBULATORY_CARE_PROVIDER_SITE_OTHER): Payer: Medicare Other | Admitting: Family Medicine

## 2022-04-05 ENCOUNTER — Encounter: Payer: Self-pay | Admitting: Family Medicine

## 2022-04-05 VITALS — BP 112/72 | HR 66 | Ht 70.0 in | Wt 183.1 lb

## 2022-04-05 DIAGNOSIS — N528 Other male erectile dysfunction: Secondary | ICD-10-CM | POA: Diagnosis not present

## 2022-04-05 MED ORDER — TADALAFIL 20 MG PO TABS: 20.0000 mg | ORAL_TABLET | ORAL | 1 refills | Status: DC | PRN

## 2022-04-05 NOTE — Patient Instructions (Addendum)
I appreciate the opportunity to provide care to you today!    Follow up:  3 months  Labs:next visit  Please pick up your medications at the pharmacy  Tadalafil Take at least 30 minutes prior to sexual activity; effect usually lasts for up to 36 hours Do not take more than one dose per day. Do not split Cialis tablets; entire dose should be taken Does not prevent transmission of sexually transmitted diseases Do not take with nitrates (medical or recreational)  Side Effects May cause headache, upset stomach, nose bleed, flushing, or nasal congestion Drinking too much alcohol can increase potential for orthostatic signs and symptoms  Please continue to a heart-healthy diet and increase your physical activities. Try to exercise for 18mns at least five times a week.      It was a pleasure to see you and I look forward to continuing to work together on your health and well-being. Please do not hesitate to call the office if you need care or have questions about your care.   Have a wonderful day and week. With Gratitude, GAlvira MondayMSN, FNP-BC

## 2022-04-05 NOTE — Progress Notes (Signed)
Established Patient Office Visit  Subjective:  Patient ID: [place] Mckinney, male    DOB: [birth year]  Age: 72 y.o. MRN: [id number]  CC:  Chief Complaint  Patient presents with   Medication Refill    Would like a refill on Cialis.      HPI [place] Mckinney is a 72 y.o. 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 For the details of today's visit, please refer to the assessment and plan.     Past Medical History:  Diagnosis Date   Diverticulitis    2017   High cholesterol    High cholesterol     Past Surgical History:  Procedure Laterality Date   TONSILLECTOMY      Family History  Problem Relation Age of Onset   Alzheimer's disease Mother    Diabetes Brother     Social History   Socioeconomic History   Marital status: Widowed    Spouse name: Ural Shor   Number of children: 0   Years of education: 14   Highest education level: Associate degree: occupational, Hotel manager, or vocational program  Occupational History   Not on file  Tobacco Use   Smoking status: Never    Passive exposure: Never   Smokeless tobacco: Never  Vaping Use   Vaping Use: Never used  Substance and Sexual Activity   Alcohol use: No   Drug use: No   Sexual activity: Not Currently    Partners: Female    Birth control/protection: None  Other Topics Concern   Not on file  Social History Narrative   Lives home alone. Goes to the church three times a week.       Social Determinants of Health   Financial Resource Strain: Low Risk  (11/26/2021)   Overall Financial Resource Strain (CARDIA)    Difficulty of Paying Living Expenses: Not hard at all  Food Insecurity: No Food Insecurity (11/26/2021)   Hunger Vital Sign    Worried About Running Out of Food in the Last Year: Never true    Ran Out of Food in the Last Year: Never true  Transportation Needs: No Transportation Needs (11/26/2021)    PRAPARE - Hydrologist (Medical): No    Lack of Transportation (Non-Medical): No  Physical Activity: Sufficiently Active (11/26/2021)   Exercise Vital Sign    Days of Exercise per Week: 3 days    Minutes of Exercise per Session: 60 min  Recent Concern: Physical Activity - Insufficiently Active (10/01/2021)   Exercise Vital Sign    Days of Exercise per Week: 1 day    Minutes of Exercise per Session: 40 min  Stress: No Stress Concern Present (11/26/2021)   [place]    Feeling of Stress : Only a little  Social Connections: Moderately Isolated (11/26/2021)   Social Connection and Isolation Panel [NHANES]    Frequency of Communication with Friends and Family: More than three times a week    Frequency of Social Gatherings with Friends and Family: More than three times a week    Attends Religious Services: More than 4 times per year    Active Member of [employer] or Organizations: No    Attends Archivist Meetings: Never    Marital Status: Widowed  Intimate Partner Violence: Not At Risk (11/26/2021)   Humiliation, Afraid, Rape, and Kick questionnaire  Fear of Current or Ex-Partner: No    Emotionally Abused: No    Physically Abused: No    Sexually Abused: No    Outpatient Medications Prior to Visit  Medication Sig Dispense Refill   EPINEPHrine 0.3 mg/0.3 mL IJ SOAJ injection Inject 0.3 mg into the skin as directed. 2 each 1   No facility-administered medications prior to visit.    Allergies  Allergen Reactions   Codeine Other (See Comments)    Other reaction(s): Other (See Comments) GI Upset  GI Upset  GI Upset      ROS Review of Systems  Constitutional:  Negative for fatigue and fever.  Eyes:  Negative for visual disturbance.  Respiratory:  Negative for chest tightness and shortness of breath.   Cardiovascular:  Negative for chest pain and palpitations.  Neurological:   Negative for dizziness and headaches.      Objective:    Physical Exam HENT:     Head: Normocephalic.     Right Ear: External ear normal.     Left Ear: External ear normal.     Nose: No congestion or rhinorrhea.     Mouth/Throat:     Mouth: Mucous membranes are moist.  Cardiovascular:     Rate and Rhythm: Regular rhythm.     Heart sounds: No murmur heard. Pulmonary:     Effort: No respiratory distress.     Breath sounds: Normal breath sounds.  Neurological:     Mental Status: He is alert.     BP 112/72   Pulse 66   Ht '5\' 10"'$  (1.778 m)   Wt 183 lb 1.3 oz (83 kg)   SpO2 95%   BMI 26.27 kg/m  Wt Readings from Last 3 Encounters:  04/05/22 183 lb 1.3 oz (83 kg)  06/15/21 185 lb 1.9 oz (84 kg)  10/02/20 178 lb (80.7 kg)    Lab Results  Component Value Date   TSH 1.960 06/19/2021   Lab Results  Component Value Date   WBC 6.1 06/19/2021   HGB 13.7 06/19/2021   HCT 40.7 06/19/2021   MCV 88 06/19/2021   PLT 177 06/19/2021   Lab Results  Component Value Date   NA 141 06/19/2021   K 4.3 06/19/2021   CO2 25 06/19/2021   GLUCOSE 89 06/19/2021   BUN 12 06/19/2021   CREATININE 1.12 06/19/2021   BILITOT 0.5 06/19/2021   ALKPHOS 62 06/19/2021   AST 13 06/19/2021   ALT 13 06/19/2021   PROT 6.4 06/19/2021   ALBUMIN 4.2 06/19/2021   CALCIUM 9.0 06/19/2021   ANIONGAP 8 10/02/2020   EGFR 72 06/19/2021   Lab Results  Component Value Date   CHOL 240 (H) 06/19/2021   Lab Results  Component Value Date   HDL 37 (L) 06/19/2021   Lab Results  Component Value Date   LDLCALC 164 (H) 06/19/2021   Lab Results  Component Value Date   TRIG 209 (H) 06/19/2021   Lab Results  Component Value Date   CHOLHDL 6.5 (H) 06/19/2021   Lab Results  Component Value Date   HGBA1C 5.8 (H) 06/19/2021      Assessment & Plan:  Other male erectile dysfunction Assessment & Plan: Patient reports that he is getting married on 04/18/2022 and would like to restart therapy with  Tadalafil 20 mg  for erectile dysfunction Tadalafil 20 mg reinstated Education as follow Tadalafil Take at least 30 minutes prior to sexual activity; effect usually lasts for up to 36 hours Do  not take more than one dose per day. Do not split Cialis tablets; entire dose should be taken Does not prevent transmission of sexually transmitted diseases Do not take with nitrates (medical or recreational)  Side Effects May cause headache, upset stomach, nose bleed, flushing, or nasal congestion Drinking too much alcohol can increase potential for orthostatic signs and symptoms  Orders: -     Tadalafil; Take 1 tablet (20 mg total) by mouth as needed for erectile dysfunction. Do not take more than one dose per day.  Dispense: 20 tablet; Refill: 1    Follow-up: Return in about 3 months (around 07/06/2022).   Alvira Monday, FNP

## 2022-04-05 NOTE — Assessment & Plan Note (Signed)
Patient reports that he is getting married on 04/18/2022 and would like to restart therapy with Tadalafil 20 mg  for erectile dysfunction Tadalafil 20 mg reinstated Education as follow Tadalafil Take at least 30 minutes prior to sexual activity; effect usually lasts for up to 36 hours Do not take more than one dose per day. Do not split Cialis tablets; entire dose should be taken Does not prevent transmission of sexually transmitted diseases Do not take with nitrates (medical or recreational)  Side Effects May cause headache, upset stomach, nose bleed, flushing, or nasal congestion Drinking too much alcohol can increase potential for orthostatic signs and symptoms

## 2022-06-18 ENCOUNTER — Encounter: Payer: Medicare Other | Admitting: Nurse Practitioner

## 2022-06-18 ENCOUNTER — Encounter: Payer: Medicare Other | Admitting: Family Medicine

## 2022-07-08 ENCOUNTER — Ambulatory Visit (INDEPENDENT_AMBULATORY_CARE_PROVIDER_SITE_OTHER): Payer: Medicare Other | Admitting: Family Medicine

## 2022-07-08 ENCOUNTER — Encounter: Payer: Self-pay | Admitting: Family Medicine

## 2022-07-08 VITALS — BP 111/65 | HR 56 | Ht 70.0 in | Wt 180.0 lb

## 2022-07-08 DIAGNOSIS — E782 Mixed hyperlipidemia: Secondary | ICD-10-CM | POA: Diagnosis not present

## 2022-07-08 DIAGNOSIS — E0789 Other specified disorders of thyroid: Secondary | ICD-10-CM

## 2022-07-08 DIAGNOSIS — H539 Unspecified visual disturbance: Secondary | ICD-10-CM | POA: Insufficient documentation

## 2022-07-08 DIAGNOSIS — Z0000 Encounter for general adult medical examination without abnormal findings: Secondary | ICD-10-CM

## 2022-07-08 DIAGNOSIS — Z0001 Encounter for general adult medical examination with abnormal findings: Secondary | ICD-10-CM

## 2022-07-08 DIAGNOSIS — F4321 Adjustment disorder with depressed mood: Secondary | ICD-10-CM

## 2022-07-08 DIAGNOSIS — H9192 Unspecified hearing loss, left ear: Secondary | ICD-10-CM | POA: Insufficient documentation

## 2022-07-08 DIAGNOSIS — R7301 Impaired fasting glucose: Secondary | ICD-10-CM

## 2022-07-08 DIAGNOSIS — E559 Vitamin D deficiency, unspecified: Secondary | ICD-10-CM | POA: Diagnosis not present

## 2022-07-08 NOTE — Assessment & Plan Note (Signed)
The patient reports grieving the loss of his wife 2 years ago from COVID He noted attending a grieving class once weekly He reports living alone but has been active in his church He has come to terms with the understanding that he has to move on  Denies suicidal thoughts and ideation Encouraged to continue attending the grieving class and to inform me of any assistance I can give

## 2022-07-08 NOTE — Assessment & Plan Note (Signed)
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

## 2022-07-08 NOTE — Patient Instructions (Addendum)
I appreciate the opportunity to provide care to you today!    Follow up:  4 months  Please call [facility] for your Eye Exam: [phone] [address], [place] [address]   Labs: please stop by the lab today to get your blood drawn (CBC, CMP, TSH, Lipid profile, HgA1c, Vit D)   Encouraged to Exercise: If you are able: 30 -60 minutes a day ,4 days a week, or 150 minutes a week. The longer the better. Combine stretch, strength, and aerobic activities Encourage to eat whole Food, Plant Predominant Nutrition is highly recommended: Eat Plenty of vegetables, Mushrooms, fruits, Legumes, Whole Grains, Nuts, seeds in lieu of processed meats, processed snacks/pastries red meat, poultry, eggs.    Referrals today- ENT, Ophthalmology   Please continue to a heart-healthy diet and increase your physical activities. Try to exercise for at least five days a week.      It was a pleasure to see you and I look forward to continuing to work together on your health and well-being. Please do not hesitate to call the office if you need care or have questions about your care.   Have a wonderful day and week. With Gratitude, Gilmore Laroche MSN, FNP-BC

## 2022-07-08 NOTE — Progress Notes (Signed)
Complete physical exam  Patient: Russell Mckinney   DOB: [birth year]   72 y.o. Male  MRN: [id number]  Subjective:    Chief Complaint  Patient presents with   Annual Exam    Cpe today, report depression from wifes loss 2 years ago.     Russell Mckinney is a 72 y.o. male who presents today for a complete physical exam. He reports consuming a general diet.  The patient recently got a gym membership to the [facility] and reports working out at least 2 days a week  He generally feels well. He reports sleeping well. He does have additional problems to discuss today.    Most recent fall risk assessment:    07/08/2022    8:10 AM  Fall Risk   Falls in the past year? 0  Number falls in past yr: 0  Injury with Fall? 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 06/15/2021   Skin tear of left upper arm without complication 06/15/2021   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   Encounter for well adult exam with abnormal findings 06/14/2020   History of anaphylaxis 06/14/2020   Immunization due 06/14/2020   Colon cancer screening 06/14/2020   Screening due 06/14/2020   Diverticulitis 02/11/2019   Situational anxiety 12/18/2018   Forgetfulness 12/18/2018   Sleep difficulties 12/18/2018   Other male erectile dysfunction 12/18/2018   Grieving 04/30/2016   Taking a statin medication 04/30/2016   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   Constipation 11/08/2013   Hypertriglyceridemia 11/08/2013   Shoulder joint pain 11/08/2013    Psychosexual dysfunction with inhibited sexual excitement 11/08/2013   Sebaceous cyst 11/08/2013   Swelling of face 11/08/2013   Vertigo 11/08/2013   Past Medical History:  Diagnosis Date   Diverticulitis    2017   High cholesterol    High cholesterol    Past Surgical History:  Procedure Laterality Date   TONSILLECTOMY     Social History   Tobacco Use   Smoking status: Never    Passive exposure: Never   Smokeless tobacco: Never  Vaping Use   Vaping Use: Never used  Substance Use Topics   Alcohol use: No   Drug use: No   Social History   Socioeconomic History   Marital status: Widowed    Spouse name: Ledarrius Rosati   Number of children: 0   Years of education: 14   Highest education level: Associate degree: occupational, Scientist, product/process development, or vocational program  Occupational History   Not on file  Tobacco Use   Smoking status: Never    Passive exposure: Never   Smokeless tobacco: Never  Vaping Use   Vaping Use: Never used  Substance and Sexual Activity   Alcohol use: No   Drug use: No   Sexual activity: Not Currently    Partners: Female    Birth control/protection: None  Other Topics Concern   Not  on file  Social History Narrative   Lives home alone. Goes to the church three times a week.       Social Determinants of Health   Financial Resource Strain: Low Risk  (11/26/2021)   Overall Financial Resource Strain (CARDIA)    Difficulty of Paying Living Expenses: Not hard at all  Food Insecurity: No Food Insecurity (11/26/2021)   Hunger Vital Sign    Worried About Running Out of Food in the Last Year: Never true    Ran Out of Food in the Last Year: Never true  Transportation Needs: No Transportation Needs (11/26/2021)   PRAPARE - Administrator, Civil Service (Medical): No    Lack of Transportation (Non-Medical): No  Physical Activity: Sufficiently Active (11/26/2021)   Exercise Vital Sign    Days of Exercise per Week: 3 days    Minutes of Exercise  per Session: 60 min  Recent Concern: Physical Activity - Insufficiently Active (10/01/2021)   Exercise Vital Sign    Days of Exercise per Week: 1 day    Minutes of Exercise per Session: 40 min  Stress: No Stress Concern Present (11/26/2021)   [employer] of Occupational Health - Occupational Stress Questionnaire    Feeling of Stress : Only a little  Social Connections: Moderately Isolated (11/26/2021)   Social Connection and Isolation Panel [NHANES]    Frequency of Communication with Friends and Family: More than three times a week    Frequency of Social Gatherings with Friends and Family: More than three times a week    Attends Religious Services: More than 4 times per year    Active Member of [employer] or Organizations: No    Attends Banker Meetings: Never    Marital Status: Widowed  Intimate Partner Violence: Not At Risk (11/26/2021)   Humiliation, Afraid, Rape, and Kick questionnaire    Fear of Current or Ex-Partner: No    Emotionally Abused: No    Physically Abused: No    Sexually Abused: No   Family Status  Relation Name Status   Mother  Deceased   Father  Deceased   Brother  Alive   Brother  Alive   Family History  Problem Relation Age of Onset   Alzheimer's disease Mother    Diabetes Brother    Allergies  Allergen Reactions   Codeine Other (See Comments)    Other reaction(s): Other (See Comments) GI Upset  GI Upset  GI Upset        Patient Care Team: Gilmore Laroche, FNP as PCP - General (Family Medicine)   Outpatient Medications Prior to Visit  Medication Sig   EPINEPHrine 0.3 mg/0.3 mL IJ SOAJ injection Inject 0.3 mg into the skin as directed.   tadalafil (CIALIS) 20 MG tablet Take 1 tablet (20 mg total) by mouth as needed for erectile dysfunction. Do not take more than one dose per day.   No facility-administered medications prior to visit.    Review of Systems  Constitutional:  Negative for chills, fever and malaise/fatigue.  HENT:   Negative for congestion and sinus pain.        Decreased hearing left ear  Eyes:  Positive for blurred vision. Negative for pain, discharge and redness.  Respiratory:  Negative for cough, sputum production and shortness of breath.   Cardiovascular:  Negative for chest pain, palpitations, claudication and leg swelling.  Gastrointestinal:  Negative for diarrhea, heartburn and nausea.  Genitourinary:  Negative for flank pain and frequency.  Musculoskeletal:  Negative for back pain and joint pain.  Skin:  Negative for itching.  Neurological:  Negative for dizziness, seizures and headaches.  Endo/Heme/Allergies:  Negative for environmental allergies.  Psychiatric/Behavioral:  Negative for memory loss. The patient does not have insomnia.        Grieving the loss of his wife 2 years ago       Objective:    BP 111/65   Pulse (!) 56   Ht 5\' 10"  (1.778 m)   Wt 180 lb 0.6 oz (81.7 kg)   SpO2 97%   BMI 25.83 kg/m  BP Readings from Last 3 Encounters:  07/08/22 111/65  04/05/22 112/72  06/15/21 108/63   Wt Readings from Last 3 Encounters:  07/08/22 180 lb 0.6 oz (81.7 kg)  04/05/22 183 lb 1.3 oz (83 kg)  06/15/21 185 lb 1.9 oz (84 kg)      Physical Exam HENT:     Head: Normocephalic.     Right Ear: Tympanic membrane and external ear normal. No drainage or tenderness. No middle ear effusion.     Left Ear: Tympanic membrane and external ear normal. No drainage or tenderness.  No middle ear effusion.     Ears:     Weber exam findings: Lateralizes right.    Right Rinne: AC > BC.    Left Rinne: AC > BC.    Nose: No congestion.     Mouth/Throat:     Mouth: Mucous membranes are moist.  Eyes:     Extraocular Movements: Extraocular movements intact.     Pupils: Pupils are equal, round, and reactive to light.  Cardiovascular:     Rate and Rhythm: Regular rhythm.     Heart sounds: No murmur heard. Pulmonary:     Effort: No respiratory distress.     Breath sounds: Normal breath  sounds.  Abdominal:     Tenderness: There is no right CVA tenderness or left CVA tenderness.  Musculoskeletal:     Right lower leg: No edema.     Left lower leg: No edema.  Neurological:     Mental Status: He is alert and oriented to person, place, and time.     GCS: GCS eye subscore is 4. GCS verbal subscore is 5. GCS motor subscore is 6.     Cranial Nerves: No facial asymmetry.     Motor: No atrophy.     Coordination: Coordination normal. Finger-Nose-Finger Test normal.     Gait: Gait normal.  Psychiatric:        Judgment: Judgment normal.     No results found for any visits on 07/08/22. Last CBC Lab Results  Component Value Date   WBC 6.1 06/19/2021   HGB 13.7 06/19/2021   HCT 40.7 06/19/2021   MCV 88 06/19/2021   MCH 29.6 06/19/2021   RDW 13.1 06/19/2021   PLT 177 06/19/2021   Last metabolic panel Lab Results  Component Value Date   GLUCOSE 89 06/19/2021   NA 141 06/19/2021   K 4.3 06/19/2021   CL 104 06/19/2021   CO2 25 06/19/2021   BUN 12 06/19/2021   CREATININE 1.12 06/19/2021   EGFR 72 06/19/2021   CALCIUM 9.0 06/19/2021   PROT 6.4 06/19/2021   ALBUMIN 4.2 06/19/2021   LABGLOB 2.2 06/19/2021   AGRATIO 1.9 06/19/2021   BILITOT 0.5 06/19/2021   ALKPHOS 62 06/19/2021   AST 13 06/19/2021   ALT 13 06/19/2021   ANIONGAP 8 10/02/2020   Last lipids Lab Results  Component  Value Date   CHOL 240 (H) 06/19/2021   HDL 37 (L) 06/19/2021   LDLCALC 164 (H) 06/19/2021   TRIG 209 (H) 06/19/2021   CHOLHDL 6.5 (H) 06/19/2021   Last hemoglobin A1c Lab Results  Component Value Date   HGBA1C 5.8 (H) 06/19/2021   Last thyroid functions Lab Results  Component Value Date   TSH 1.960 06/19/2021   Last vitamin D Lab Results  Component Value Date   VD25OH 39.5 06/19/2021   Last vitamin B12 and Folate No results found for: "VITAMINB12", "FOLATE"      Assessment & Plan:    Routine Health Maintenance and Physical Exam  Immunization History  Administered  Date(s) Administered   Moderna Sars-Covid-2 Vaccination 10/07/2019, 11/02/2019   Pneumococcal Conjugate-13 12/05/2017   Pneumococcal Polysaccharide-23 06/14/2020   Td (Adult),5 Lf Tetanus Toxid, Preservative Free 03/12/2006   Tdap 11/17/2015    Health Maintenance  Topic Date Due   Hepatitis C Screening  Never done   COVID-19 Vaccine (3 - Moderna risk series) 11/30/2019   Zoster Vaccines- Shingrix (1 of 2) 10/08/2022 (Originally 11/17/1969)   INFLUENZA VACCINE  09/05/2022   Medicare Annual Wellness (AWV)  10/02/2022   Fecal DNA (Cologuard)  12/21/2023   DTaP/Tdap/Td (2 - Td or Tdap) 11/16/2025   Pneumonia Vaccine 43+ Years old  Completed   HPV VACCINES  Aged Out   Colonoscopy  Discontinued    Discussed health benefits of physical activity, and encouraged him to engage in regular exercise appropriate for his age and condition.  Annual physical exam Assessment & Plan: Physical exam as documented Discussed heart-healthy diet  Encouraged to Exercise: If you are able: 30 -60 minutes a day ,4 days a week, or 150 minutes a week. The longer the better. Combine stretch, strength, and aerobic activities Encourage to eat whole Food, Plant Predominant Nutrition is highly recommended: Eat Plenty of vegetables, Mushrooms, fruits, Legumes, Whole Grains, Nuts, seeds in lieu of processed meats, processed snacks/pastries red meat, poultry, eggs.  Will f/u in 1 year for CPE      Grieving Assessment & Plan: The patient reports grieving the loss of his wife 2 years ago from COVID He noted attending a grieving class once weekly He reports living alone but has been active in his church He has come to terms with the understanding that he has to move on  Denies suicidal thoughts and ideation Encouraged to continue attending the grieving class and to inform me of any assistance I can give    Visual disturbance Assessment & Plan: Complains of more frequent occipital headache that radiates to the  frontal lobe as of recently Denies shooting, stabbing pain when brushing his hair or exposure to cold Less likelihood of occipital neuralgia History of migraine headaches, denies sensitivity to lights and noises and nausea and vomiting Denies red flag symptoms of headache knowing that this is not the worst headache of his life with no stiffness in his neck or fever noted Headache is relieved with over-the-counter analgesic Last eye examination was 2 years ago Headache likely due to poor vision Snellen test in the clinic showed a right eye 20/50 and left eye 20/40 with both eyes 20/40 Encouraged the patient to make an appointment with my eye doctor as soon as possible to assess his vision Will also place a referral to ophthalmology   Orders: -     Ambulatory referral to Ophthalmology  Decreased hearing of left ear Assessment & Plan: 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 lateralized to the right ear,  indicating sensorineural hearing loss Air conduction was greater than bone conduction in both ears Reports decreased hearing in the left ear and would like to be evaluated for hearing aid Referral placed to ENT  Orders: -     Ambulatory referral to ENT  Mixed hyperlipidemia -     CBC with Differential/Platelet -     CMP14+EGFR -     Lipid panel  Vitamin D deficiency -     VITAMIN D 25 Hydroxy (Vit-D Deficiency, Fractures)  Impaired fasting blood sugar -     Hemoglobin A1c  Other specified disorders of thyroid -     TSH + free T4    Return in about 1 year (around 07/08/2023).     Gilmore Laroche, FNP

## 2022-07-08 NOTE — Assessment & Plan Note (Signed)
Physical exam as documented Discussed heart-healthy diet  Encouraged to Exercise: If you are able: 30 -60 minutes a day ,4 days a week, or 150 minutes a week. The longer the better. Combine stretch, strength, and aerobic activities Encourage to eat whole Food, Plant Predominant Nutrition is highly recommended: Eat Plenty of vegetables, Mushrooms, fruits, Legumes, Whole Grains, Nuts, seeds in lieu of processed meats, processed snacks/pastries red meat, poultry, eggs.  Will f/u in 1 year for CPE    

## 2022-07-08 NOTE — Assessment & Plan Note (Signed)
Complains of more frequent occipital headache that radiates to the frontal lobe as of recently Denies shooting, stabbing pain when brushing his hair or exposure to cold Less likelihood of occipital neuralgia History of migraine headaches, denies sensitivity to lights and noises and nausea and vomiting Denies red flag symptoms of headache knowing that this is not the worst headache of his life with no stiffness in his neck or fever noted Headache is relieved with over-the-counter analgesic Last eye examination was 2 years ago Headache likely due to poor vision Snellen test in the clinic showed a right eye 20/50 and left eye 20/40 with both eyes 20/40 Encouraged the patient to make an appointment with my eye doctor as soon as possible to assess his vision Will also place a referral to ophthalmology

## 2022-07-09 ENCOUNTER — Other Ambulatory Visit: Payer: Self-pay | Admitting: Family Medicine

## 2022-07-09 DIAGNOSIS — E782 Mixed hyperlipidemia: Secondary | ICD-10-CM

## 2022-07-09 MED ORDER — ROSUVASTATIN CALCIUM 10 MG PO TABS: 10.0000 mg | ORAL_TABLET | Freq: Every day | ORAL | 3 refills | Status: DC

## 2022-07-09 NOTE — Progress Notes (Unsigned)
The 10-year ASCVD risk score (Arnett DK, et al., 2019) is: 15.5%   Values used to calculate the score:     Age: 72 years     Sex: Male     Is Non-Hispanic African American: No     Diabetic: No     Tobacco smoker: No     Systolic Blood Pressure: 111 mmHg     Is BP treated: No     HDL Cholesterol: 44 mg/dL     Total Cholesterol: 236 mg/dL

## 2022-07-09 NOTE — Progress Notes (Signed)
Please inform the patient  that a prescription for rosuvastatin 10 mg is sent to his pharmacy because his cholesterol levels are elevated.  I want his LDL cholesterol to be less than 100.  His hemoglobin A1c is 5.7, this indicate that he is prediabetic. I recommend avoiding simple carbohydrates, including cakes, sweet desserts, ice cream, soda (diet or regular), sweet tea, candies, chips, cookies, store-bought juices, alcohol in excess of 1-2 drinks a day, lemonade, artificial sweeteners, donuts, coffee creamers, and sugar-free products.  I recommend avoiding greasy, fatty foods with increased physical activity.

## 2022-07-10 LAB — CMP14+EGFR
ALT: 34 IU/L (ref 0–44)
AST: 15 IU/L (ref 0–40)
Albumin/Globulin Ratio: 1.9 (ref 1.2–2.2)
Albumin: 4.3 g/dL (ref 3.9–4.9)
Alkaline Phosphatase: 95 IU/L (ref 44–121)
BUN/Creatinine Ratio: 15 (ref 10–24)
BUN: 17 mg/dL (ref 8–27)
Bilirubin Total: 0.4 mg/dL (ref 0.0–1.2)
CO2: 21 mmol/L (ref 20–29)
Calcium: 9.5 mg/dL (ref 8.6–10.2)
Chloride: 106 mmol/L (ref 96–106)
Creatinine, Ser: 1.15 mg/dL (ref 0.76–1.27)
Globulin, Total: 2.3 g/dL (ref 1.5–4.5)
Glucose: 84 mg/dL (ref 70–99)
Potassium: 4.6 mmol/L (ref 3.5–5.2)
Sodium: 144 mmol/L (ref 134–144)
Total Protein: 6.6 g/dL (ref 6.0–8.5)
eGFR: 69 mL/min/{1.73_m2} (ref >59)

## 2022-07-10 LAB — LIPID PANEL
Chol/HDL Ratio: 5.4 ratio — ABNORMAL HIGH (ref 0.0–5.0)
Cholesterol, Total: 236 mg/dL — ABNORMAL HIGH (ref 100–199)
HDL: 44 mg/dL (ref >39)
LDL Chol Calc (NIH): 155 mg/dL — ABNORMAL HIGH (ref 0–99)
Triglycerides: 202 mg/dL — ABNORMAL HIGH (ref 0–149)
VLDL Cholesterol Cal: 37 mg/dL (ref 5–40)

## 2022-07-10 LAB — CBC WITH DIFFERENTIAL/PLATELET
Basophils Absolute: 0.1 10*3/uL (ref 0.0–0.2)
Basos: 1 %
EOS (ABSOLUTE): 0.2 10*3/uL (ref 0.0–0.4)
Eos: 2 %
Hematocrit: 42.4 % (ref 37.5–51.0)
Hemoglobin: 13.8 g/dL (ref 13.0–17.7)
Immature Grans (Abs): 0 10*3/uL (ref 0.0–0.1)
Immature Granulocytes: 0 %
Lymphocytes Absolute: 1.8 10*3/uL (ref 0.7–3.1)
Lymphs: 22 %
MCH: 29.8 pg (ref 26.6–33.0)
MCHC: 32.5 g/dL (ref 31.5–35.7)
MCV: 92 fL (ref 79–97)
Monocytes Absolute: 0.6 10*3/uL (ref 0.1–0.9)
Monocytes: 7 %
Neutrophils Absolute: 5.8 10*3/uL (ref 1.4–7.0)
Neutrophils: 68 %
Platelets: 171 10*3/uL (ref 150–450)
RBC: 4.63 x10E6/uL (ref 4.14–5.80)
RDW: 13.3 % (ref 11.6–15.4)
WBC: 8.4 10*3/uL (ref 3.4–10.8)

## 2022-07-10 LAB — VITAMIN D 25 HYDROXY (VIT D DEFICIENCY, FRACTURES): Vit D, 25-Hydroxy: 50.8 ng/mL (ref 30.0–100.0)

## 2022-07-10 LAB — HEMOGLOBIN A1C
Est. average glucose Bld gHb Est-mCnc: 117 mg/dL
Hgb A1c MFr Bld: 5.7 % — ABNORMAL HIGH (ref 4.8–5.6)

## 2022-07-10 LAB — TSH+FREE T4
Free T4: 1.41 ng/dL (ref 0.82–1.77)
TSH: 1.86 u[IU]/mL (ref 0.450–4.500)

## 2022-10-30 ENCOUNTER — Ambulatory Visit (INDEPENDENT_AMBULATORY_CARE_PROVIDER_SITE_OTHER): Payer: Medicare Other

## 2022-10-30 VITALS — Ht 70.0 in | Wt 180.0 lb

## 2022-10-30 DIAGNOSIS — Z0001 Encounter for general adult medical examination with abnormal findings: Secondary | ICD-10-CM | POA: Diagnosis not present

## 2022-10-30 DIAGNOSIS — H9193 Unspecified hearing loss, bilateral: Secondary | ICD-10-CM | POA: Diagnosis not present

## 2022-10-30 DIAGNOSIS — Z1159 Encounter for screening for other viral diseases: Secondary | ICD-10-CM

## 2022-10-30 DIAGNOSIS — Z0000 Encounter for general adult medical examination without abnormal findings: Secondary | ICD-10-CM

## 2022-10-30 NOTE — Progress Notes (Signed)
 Because this visit was a virtual/telehealth visit,  certain criteria was not obtained, such a blood pressure, CBG if applicable, and timed get up and go. Any medications not marked as "taking" were not mentioned during the medication reconciliation part of the visit. Any vitals not documented were not able to be obtained due to this being a telehealth visit or patient was unable to self-report a recent blood pressure reading due to a lack of equipment at home via telehealth. Vitals that have been documented are verbally provided by the patient.   Subjective:   Russell Mckinney is a 72 y.o. male who presents for Medicare Annual/Subsequent preventive examination.  Visit Complete: Virtual  I connected with  Russell Mckinney on 10/30/22 by a audio enabled telemedicine application and verified that I am speaking with the correct person using two identifiers.  Patient Location: Home  Provider Location: Home Office  I discussed the limitations of evaluation and management by telemedicine. The patient expressed understanding and agreed to proceed.  Patient Medicare AWV questionnaire was completed by the patient on na; I have confirmed that all information answered by patient is correct and no changes since this date.  Cardiac Risk Factors include: advanced age (>30men, >5 women);dyslipidemia;male gender     Objective:    Today's Vitals   10/30/22 1036  Weight: 180 lb (81.6 kg)  Height: 5\' 10"  (1.778 m)   Body mass index is 25.83 kg/m.     10/30/2022   10:39 AM 11/26/2021    4:09 PM 09/29/2020    3:05 PM 12/14/2017   11:44 PM 07/28/2015    5:01 PM  Advanced Directives  Does Patient Have a Medical Advance Directive? No No No No No  Would patient like information on creating a medical advance directive? No - Patient declined No - Patient declined No - Patient declined      Current Medications (verified) Outpatient Encounter Medications as of 10/30/2022  Medication Sig   EPINEPHrine 0.3 mg/0.3 mL  IJ SOAJ injection Inject 0.3 mg into the skin as directed.   rosuvastatin (CRESTOR) 10 MG tablet Take 1 tablet (10 mg total) by mouth daily.   tadalafil (CIALIS) 20 MG tablet Take 1 tablet (20 mg total) by mouth as needed for erectile dysfunction. Do not take more than one dose per day.   No facility-administered encounter medications on file as of 10/30/2022.    Allergies (verified) Codeine   History: Past Medical History:  Diagnosis Date   Diverticulitis    2017   High cholesterol    High cholesterol    Past Surgical History:  Procedure Laterality Date   TONSILLECTOMY     Family History  Problem Relation Age of Onset   Alzheimer's disease Mother    Diabetes Brother    Social History   Socioeconomic History   Marital status: Widowed    Spouse name: Mikayel Bloemer   Number of children: 0   Years of education: 14   Highest education level: Associate degree: occupational, Scientist, product/process development, or vocational program  Occupational History   Not on file  Tobacco Use   Smoking status: Never    Passive exposure: Never   Smokeless tobacco: Never  Vaping Use   Vaping status: Never Used  Substance and Sexual Activity   Alcohol use: No   Drug use: No   Sexual activity: Not Currently    Partners: Female    Birth control/protection: None  Other Topics Concern   Not on file  Social History Narrative  Lives home alone. Goes to the church three times a week.       Social Determinants of Health   Financial Resource Strain: Low Risk  (10/30/2022)   Overall Financial Resource Strain (CARDIA)    Difficulty of Paying Living Expenses: Not hard at all  Food Insecurity: No Food Insecurity (10/30/2022)   Hunger Vital Sign    Worried About Running Out of Food in the Last Year: Never true    Ran Out of Food in the Last Year: Never true  Transportation Needs: No Transportation Needs (10/30/2022)   PRAPARE - Administrator, Civil Service (Medical): No    Lack of Transportation  (Non-Medical): No  Physical Activity: Sufficiently Active (10/30/2022)   Exercise Vital Sign    Days of Exercise per Week: 7 days    Minutes of Exercise per Session: 30 min  Stress: No Stress Concern Present (10/30/2022)   [employer] of Occupational Health - Occupational Stress Questionnaire    Feeling of Stress : Only a little  Social Connections: Moderately Isolated (10/30/2022)   Social Connection and Isolation Panel [NHANES]    Frequency of Communication with Friends and Family: More than three times a week    Frequency of Social Gatherings with Friends and Family: More than three times a week    Attends Religious Services: More than 4 times per year    Active Member of [employer] or Organizations: No    Attends Banker Meetings: Never    Marital Status: Widowed    Tobacco Counseling Counseling given: Yes   Clinical Intake:  Pre-visit preparation completed: Yes  Pain : No/denies pain     BMI - recorded: 25.83 Nutritional Status: BMI 25 -29 Overweight Nutritional Risks: None Diabetes: No  How often do you need to have someone help you when you read instructions, pamphlets, or other written materials from your doctor or pharmacy?: 1 - Never  Interpreter Needed?: No  Information entered by ::  Russell Mckinney, CMA   Activities of Daily Living    10/30/2022   10:38 AM 11/26/2021    4:08 PM  In your present state of health, do you have any difficulty performing the following activities:  Hearing? 0 0  Vision? 0 0  Difficulty concentrating or making decisions? 0 0  Walking or climbing stairs? 0 0  Dressing or bathing? 0 0  Doing errands, shopping? 0 0  Preparing Food and eating ? N N  Using the Toilet? N N  In the past six months, have you accidently leaked urine? N N  Do you have problems with loss of bowel control? N N  Managing your Medications? N N  Managing your Finances? N N  Housekeeping or managing your Housekeeping? N N    Patient Care  Team: Gilmore Laroche, FNP as PCP - General (Family Medicine)  Indicate any recent Medical Services you may have received from other than [facility] providers in the past year (date may be approximate).     Assessment:   This is a routine wellness examination for Russell Mckinney.  Hearing/Vision screen Hearing Screening - Comments:: Referral placed for audiology because patient stated he is starting to have difficulty hearing    Goals Addressed             This Visit's Progress    Patient Stated       Start swimming and exercising more. [facility] joined the [facility]       Depression Screen    10/30/2022  10:41 AM 07/08/2022    8:10 AM 04/05/2022    9:52 AM 11/26/2021    4:02 PM 06/15/2021    8:12 AM 09/29/2020    3:07 PM 09/29/2020    3:03 PM  PHQ 2/9 Scores  PHQ - 2 Score 0 2 0 1 1 4 4   PHQ- 9 Score  5 0   4 4    Fall Risk    10/30/2022   10:40 AM 07/08/2022    8:10 AM 04/05/2022    9:52 AM 11/26/2021    4:07 PM 10/01/2021   12:03 PM  Fall Risk   Falls in the past year? 0 0 0 0 0  Number falls in past yr: 0 0 0 0 0  Injury with Fall? 0 0 0 0 0  Risk for fall due to : No Fall Risks No Fall Risks No Fall Risks No Fall Risks   Follow up Falls prevention discussed Falls evaluation completed Falls evaluation completed Falls evaluation completed;Education provided;Falls prevention discussed     MEDICARE RISK AT HOME: Medicare Risk at Home Any stairs in or around the home?: Yes If so, are there any without handrails?: No Home free of loose throw rugs in walkways, pet beds, electrical cords, etc?: Yes Adequate lighting in your home to reduce risk of falls?: Yes Life alert?: No Use of a cane, walker or w/c?: No Grab bars in the bathroom?: No Shower chair or bench in shower?: No Elevated toilet seat or a handicapped toilet?: No  TIMED UP AND GO:  Was the test performed?  No    Cognitive Function:    12/18/2018   10:44 AM  MMSE - Mini Mental State Exam  Orientation to time 5  Orientation to  Place 5  Registration 3  Attention/ Calculation 5  Recall 3  Language- name 2 objects 2  Language- repeat 1  Language- follow 3 step command 3  Language- read & follow direction 1  Write a sentence 1  Copy design 1  Total score 30        10/30/2022   10:41 AM 10/01/2021   12:05 PM 09/29/2020    3:13 PM  6CIT Screen  What Year? 0 points 0 points 0 points  What month? 0 points 0 points 0 points  What time? 0 points 0 points 0 points  Count back from 20 0 points 0 points 0 points  Months in reverse 0 points 0 points 0 points  Repeat phrase 0 points 0 points 2 points  Total Score 0 points 0 points 2 points    Immunizations Immunization History  Administered Date(s) Administered   Moderna Sars-Covid-2 Vaccination 10/07/2019, 11/02/2019   Pneumococcal Conjugate-13 12/05/2017   Pneumococcal Polysaccharide-23 06/14/2020   Td (Adult),5 Lf Tetanus Toxid, Preservative Free 03/12/2006   Tdap 11/17/2015    TDAP status: Up to date  Flu Vaccine status: Due, Education has been provided regarding the importance of this vaccine. Advised may receive this vaccine at local pharmacy or Health Dept. Aware to provide a copy of the vaccination record if obtained from local pharmacy or Health Dept. Verbalized acceptance and understanding.  Pneumococcal vaccine status: Up to date  Covid-19 vaccine status: Information provided on how to obtain vaccines.   Qualifies for Shingles Vaccine? Yes   Zostavax completed No   Shingrix Completed?: No.    Education has been provided regarding the importance of this vaccine. Patient has been advised to call insurance company to determine out of pocket expense if  they have not yet received this vaccine. Advised may also receive vaccine at local pharmacy or Health Dept. Verbalized acceptance and understanding.  Screening Tests Health Maintenance  Topic Date Due   Hepatitis C Screening  Never done   Zoster Vaccines- Shingrix (1 of 2) Never done   COVID-19  Vaccine (3 - Moderna risk series) 11/30/2019   INFLUENZA VACCINE  Never done   Medicare Annual Wellness (AWV)  10/02/2022   Fecal DNA (Cologuard)  12/21/2023   DTaP/Tdap/Td (2 - Td or Tdap) 11/16/2025   Pneumonia Vaccine 81+ Years old  Completed   HPV VACCINES  Aged Out    Health Maintenance  Health Maintenance Due  Topic Date Due   Hepatitis C Screening  Never done   Zoster Vaccines- Shingrix (1 of 2) Never done   COVID-19 Vaccine (3 - Moderna risk series) 11/30/2019   INFLUENZA VACCINE  Never done   Medicare Annual Wellness (AWV)  10/02/2022    Colorectal cancer screening: Type of screening: Cologuard. Completed 12/20/2020. Repeat every 3 years  Lung Cancer Screening: (Low Dose CT Chest recommended if Age 58-80 years, 20 pack-year currently smoking OR have quit w/in 15years.) does not qualify.   Additional Screening:  Hepatitis C Screening: does qualify; ordered 10/30/2022  Vision Screening: Recommended annual ophthalmology exams for early detection of glaucoma and other disorders of the eye. Is the patient up to date with their annual eye exam?  Yes  Who is the provider or what is the name of the office in which the patient attends annual eye exams? [facility] If pt is not established with a provider, would they like to be referred to a provider to establish care? No .   Dental Screening: Recommended annual dental exams for proper oral hygiene  Diabetic Foot Exam: na  Community Resource Referral / Chronic Care Management: CRR required this visit?  No   CCM required this visit?  No     Plan:     I have personally reviewed and noted the following in the patient's chart:   Medical and social history Use of alcohol, tobacco or illicit drugs  Current medications and supplements including opioid prescriptions. Patient is not currently taking opioid prescriptions. Functional ability and status Nutritional status Physical activity Advanced  directives List of other physicians Hospitalizations, surgeries, and ER visits in previous 12 months Vitals Screenings to include cognitive, depression, and falls Referrals and appointments  In addition, I have reviewed and discussed with patient certain preventive protocols, quality metrics, and best practice recommendations. A written personalized care plan for preventive services as well as general preventive health recommendations were provided to patient.     Jordan Hawks Hendy Brindle, CMA   10/30/2022   After Visit Summary: (Mail) Due to this being a telephonic visit, the after visit summary with patients personalized plan was offered to patient via mail

## 2022-10-30 NOTE — Patient Instructions (Signed)
Russell Mckinney , Thank you for taking time to come for your Medicare Wellness Visit. I appreciate your ongoing commitment to your health goals. Please review the following plan we discussed and let me know if I can assist you in the future.   Referrals/Orders/Follow-Ups/Clinician Recommendations:  Next Medicare Annual Wellness Visit: January 28, 2024 at 11:20 am virtual visit  You have been referred to an audiologist to have a hearing test.If you haven't heard from them in the next week, please call their office to schedule your appointment.   Russell Mckinney Address: [address], [place], [place] [address] Phone: [phone]  A Hepatitis C Screening has been ordered for you today. You do not have to fast to have this lab drawn.    This is a list of the screening recommended for you and due dates:  Health Maintenance  Topic Date Due   Hepatitis C Screening  Never done   Zoster (Shingles) Vaccine (1 of 2) Never done   COVID-19 Vaccine (3 - Moderna risk series) 11/30/2019   Flu Shot  Never done   Medicare Annual Wellness Visit  10/30/2023   Cologuard (Stool DNA test)  12/21/2023   DTaP/Tdap/Td vaccine (2 - Td or Tdap) 11/16/2025   Pneumonia Vaccine  Completed   HPV Vaccine  Aged Out    Advanced directives: (Declined) Advance directive discussed with you today. Even though you declined this today, please call our office should you change your mind, and we can give you the proper paperwork for you to fill out.  Next Medicare Annual Wellness Visit scheduled for next year: Yes  Preventive Care 70 Years and Older, Male Preventive care refers to lifestyle choices and visits with your health care provider that can promote health and wellness. Preventive care visits are also called wellness exams. What can I expect for my preventive care visit? Counseling During your preventive care visit, your health care provider may ask about your: Medical history, including: Past medical  problems. Family medical history. History of falls. Current health, including: Emotional well-being. Home life and relationship well-being. Sexual activity. Memory and ability to understand (cognition). Lifestyle, including: Alcohol, nicotine or tobacco, and drug use. Access to firearms. Diet, exercise, and sleep habits. Work and work Astronomer. Sunscreen use. Safety issues such as seatbelt and bike helmet use. Physical exam Your health care provider will check your: Height and weight. These may be used to calculate your BMI (body mass index). BMI is a measurement that tells if you are at a healthy weight. Waist circumference. This measures the distance around your waistline. This measurement also tells if you are at a healthy weight and may help predict your risk of certain diseases, such as type 2 diabetes and high blood pressure. Heart rate and blood pressure. Body temperature. Skin for abnormal spots. What immunizations do I need?  Vaccines are usually given at various ages, according to a schedule. Your health care provider will recommend vaccines for you based on your age, medical history, and lifestyle or other factors, such as travel or where you work. What tests do I need? Screening Your health care provider may recommend screening tests for certain conditions. This may include: Lipid and cholesterol levels. Diabetes screening. This is done by checking your blood sugar (glucose) after you have not eaten for a while (fasting). Hepatitis C test. Hepatitis B test. HIV (human immunodeficiency virus) test. STI (sexually transmitted infection) testing, if you are at risk. Lung cancer screening. Colorectal cancer screening. Prostate cancer  screening. Abdominal aortic aneurysm (AAA) screening. You may need this if you are a current or former smoker. Talk with your health care provider about your test results, treatment options, and if necessary, the need for more  tests. Follow these instructions at home: Eating and drinking  Eat a diet that includes fresh fruits and vegetables, whole grains, lean protein, and low-fat dairy products. Limit your intake of foods with high amounts of sugar, saturated fats, and salt. Take vitamin and mineral supplements as recommended by your health care provider. Do not drink alcohol if your health care provider tells you not to drink. If you drink alcohol: Limit how much you have to 0-2 drinks a day. Know how much alcohol is in your drink. In the [place] one drink equals one 12 oz bottle of beer (355 mL), one 5 oz glass of wine (148 mL), or one 1 oz glass of hard liquor (44 mL). Lifestyle Brush your teeth every morning and night with fluoride toothpaste. Floss one time each day. Exercise for at least 30 minutes 5 or more days each week. Do not use any products that contain nicotine or tobacco. These products include cigarettes, chewing tobacco, and vaping devices, such as e-cigarettes. If you need help quitting, ask your health care provider. Do not use drugs. If you are sexually active, practice safe sex. Use a condom or other form of protection to prevent STIs. Take aspirin only as told by your health care provider. Make sure that you understand how much to take and what form to take. Work with your health care provider to find out whether it is safe and beneficial for you to take aspirin daily. Ask your health care provider if you need to take a cholesterol-lowering medicine (statin). Find healthy ways to manage stress, such as: Meditation, yoga, or listening to music. Journaling. Talking to a trusted person. Spending time with friends and family. Safety Always wear your seat belt while driving or riding in a vehicle. Do not drive: If you have been drinking alcohol. Do not ride with someone who has been drinking. When you are tired or distracted. While texting. If you have been using any mind-altering substances  or drugs. Wear a helmet and other protective equipment during sports activities. If you have firearms in your house, make sure you follow all gun safety procedures. Minimize exposure to UV radiation to reduce your risk of skin cancer. What's next? Visit your health care provider once a year for an annual wellness visit. Ask your health care provider how often you should have your eyes and teeth checked. Stay up to date on all vaccines. This information is not intended to replace advice given to you by your health care provider. Make sure you discuss any questions you have with your health care provider. Document Revised: 07/19/2020 Document Reviewed: 07/19/2020 Elsevier Patient Education  2024 [employer]. Understanding Your Risk for Falls Millions of people have serious injuries from falls each year. It is important to understand your risk of falling. Talk with your health care provider about your risk and what you can do to lower it. If you do have a serious fall, make sure to tell your provider. Falling once raises your risk of falling again. How can falls affect me? Serious injuries from falls are common. These include: Broken bones, such as hip fractures. Head injuries, such as traumatic brain injuries (TBI) or concussions. A fear of falling can cause you to avoid activities and stay at home. This can  make your muscles weaker and raise your risk for a fall. What can increase my risk? There are a number of risk factors that increase your risk for falling. The more risk factors you have, the higher your risk of falling. Serious injuries from a fall happen most often to people who are older than 70 years old. Teenagers and young adults ages 29-29 are also at higher risk. Common risk factors include: Weakness in the lower body. Being generally weak or confused due to long-term (chronic) illness. Dizziness or balance problems. Poor vision. Medicines that cause dizziness or drowsiness.  These may include: Medicines for your blood pressure, heart, anxiety, insomnia, or swelling (edema). Pain medicines. Muscle relaxants. Other risk factors include: Drinking alcohol. Having had a fall in the past. Having foot pain or wearing improper footwear. Working at a dangerous job. Having any of the following in your home: Tripping hazards, such as floor clutter or loose rugs. Poor lighting. Pets. Having dementia or memory loss. What actions can I take to lower my risk of falling?     Physical activity Stay physically fit. Do strength and balance exercises. Consider taking a regular class to build strength and balance. Yoga and tai chi are good options. Vision Have your eyes checked every year and your prescription for glasses or contacts updated as needed. Shoes and walking aids Wear non-skid shoes. Wear shoes that have rubber soles and low heels. Do not wear high heels. Do not walk around the house in socks or slippers. Use a cane or walker as told by your provider. Home safety Attach secure railings on both sides of your stairs. Install grab bars for your bathtub, shower, and toilet. Use a non-skid mat in your bathtub or shower. Attach bath mats securely with double-sided, non-slip rug tape. Use good lighting in all rooms. Keep a flashlight near your bed. Make sure there is a clear path from your bed to the bathroom. Use night-lights. Do not use throw rugs. Make sure all carpeting is taped or tacked down securely. Remove all clutter from walkways and stairways, including extension cords. Repair uneven or broken steps and floors. Avoid walking on icy or slippery surfaces. Walk on the grass instead of on icy or slick sidewalks. Use ice melter to get rid of ice on walkways in the winter. Use a cordless phone. Questions to ask your health care provider Can you help me check my risk for a fall? Do any of my medicines make me more likely to fall? Should I take a vitamin D  supplement? What exercises can I do to improve my strength and balance? Should I make an appointment to have my vision checked? Do I need a bone density test to check for weak bones (osteoporosis)? Would it help to use a cane or a walker? Where to find more information Centers for Disease Control and Prevention, [employer]: TonerPromos.no Community-Based Fall Prevention Programs: TonerPromos.no [employer] on Aging: BaseRingTones.pl Contact a health care provider if: You fall at home. You are afraid of falling at home. You feel weak, drowsy, or dizzy. This information is not intended to replace advice given to you by your health care provider. Make sure you discuss any questions you have with your health care provider. Document Revised: 09/24/2021 Document Reviewed: 09/24/2021 Elsevier Patient Education  2024 [employer].

## 2022-11-07 ENCOUNTER — Ambulatory Visit (INDEPENDENT_AMBULATORY_CARE_PROVIDER_SITE_OTHER): Payer: Medicare Other | Admitting: Family Medicine

## 2022-11-07 ENCOUNTER — Encounter: Payer: Self-pay | Admitting: Family Medicine

## 2022-11-07 VITALS — BP 126/77 | HR 52 | Ht 70.0 in | Wt 183.1 lb

## 2022-11-07 DIAGNOSIS — E038 Other specified hypothyroidism: Secondary | ICD-10-CM | POA: Diagnosis not present

## 2022-11-07 DIAGNOSIS — E559 Vitamin D deficiency, unspecified: Secondary | ICD-10-CM | POA: Diagnosis not present

## 2022-11-07 DIAGNOSIS — Z7189 Other specified counseling: Secondary | ICD-10-CM | POA: Diagnosis not present

## 2022-11-07 DIAGNOSIS — E781 Pure hyperglyceridemia: Secondary | ICD-10-CM | POA: Diagnosis not present

## 2022-11-07 DIAGNOSIS — F320 Major depressive disorder, single episode, mild: Secondary | ICD-10-CM

## 2022-11-07 DIAGNOSIS — E7849 Other hyperlipidemia: Secondary | ICD-10-CM | POA: Diagnosis not present

## 2022-11-07 DIAGNOSIS — R7301 Impaired fasting glucose: Secondary | ICD-10-CM

## 2022-11-07 MED ORDER — SERTRALINE HCL 25 MG PO TABS: 25.0000 mg | ORAL_TABLET | Freq: Every day | ORAL | 3 refills | Status: DC

## 2022-11-07 NOTE — Patient Instructions (Addendum)
I appreciate the opportunity to provide care to you today!    Follow up:  6 weeks   Labs: please stop by the lab today to get your blood drawn (CBC, CMP, TSH, Lipid profile, HgA1c, Vit D)   Depression -Take at the same time each day, with or without food -May take at least 2 weeks for benefit, and 6-8 weeks for full effect -Do not abruptly discontinue to avoid withdrawal symptoms -Report worsening mood or suicidal thinking to providers and caregivers  For managing prediabetes, I recommend the following lifestyle changes:  Reduce Intake of High-Sugar Foods and Beverages: Limit foods and drinks high in sugar to help regulate blood sugar levels. Increase Consumption of Nutrient-Rich Foods: Focus on incorporating more fruits, vegetables, and whole grains into your diet. Choose Lean Proteins: Opt for lean proteins such as chicken, fish, beans, and legumes. Select Low-Fat Dairy Products: Choose low-fat or non-fat dairy options. Minimize Saturated Fats, Trans Fats, and Cholesterol: Reduce intake of foods high in saturated fats, trans fatty acids, and cholesterol. Engage in Regular Physical Activity: Aim for at least 30 minutes of brisk walking or other moderate activity at least 5 days a week.   Here are some foods to avoid or reduce in your diet to help manage cholesterol levels:  Fried Foods:Deep-fried items such as french fries, fried chicken, and fried snacks are high in unhealthy fats and can raise LDL (bad) cholesterol levels. Processed Meats:Foods like bacon, sausage, hot dogs, and deli meats are often high in saturated fat and cholesterol. Full-Fat Dairy Products:Whole milk, full-fat yogurt, butter, cream, and cheese are rich in saturated fats, which can increase cholesterol levels. Baked Goods and Sweets:Pastries, cakes, cookies, and donuts often contain trans fats and added sugars, which can raise LDL cholesterol and lower HDL (good) cholesterol. Red Meat:Beef, lamb, and pork are high  in saturated fat. Lean cuts or plant-based protein alternatives are better options. Lard and Shortening:Used in some baked goods, lard and shortening are high in trans fats and should be avoided. Fast Food:Many fast food items are cooked with unhealthy oils and contain high amounts of saturated and trans fats. Processed Snacks:Chips, crackers, and certain microwave popcorns can contain trans fats and high levels of unhealthy oils. Shellfish:While nutritious in other ways, some shellfish like shrimp, lobster, and crab are high in cholesterol. They should be consumed in moderation. Coconut and Palm Oils:these oils are high in saturated fat and can raise cholesterol levels when used in cooking or baking.     Referrals today- Grief counseling     Please continue to a heart-healthy diet and increase your physical activities. Try to exercise for at least five days a week.    It was a pleasure to see you and I look forward to continuing to work together on your health and well-being. Please do not hesitate to call the office if you need care or have questions about your care.  In case of emergency, please visit the Emergency Department for urgent care, or contact our clinic at [phone] to schedule an appointment. We're here to help you!   Have a wonderful day and week. With Gratitude, Gilmore Laroche MSN, FNP-BC

## 2022-11-07 NOTE — Progress Notes (Signed)
Established Patient Office Visit  Subjective:  Patient ID: Russell Mckinney, male    DOB: 06/18/1950  Age: 72 y.o. MRN: [id number]  CC:  Chief Complaint  Patient presents with   Care Management    4 month f/u, pt reports still grieving from his wife's passing 3 years ago. Is interested in counseling.     HPI Russell Mckinney is a 72 y.o. male with past medical history of Grieving presents for f/u of  chronic medical conditions. For the details of today's visit, please refer to the assessment and plan.     Past Medical History:  Diagnosis Date   Diverticulitis    2017   High cholesterol    High cholesterol     Past Surgical History:  Procedure Laterality Date   TONSILLECTOMY      Family History  Problem Relation Age of Onset   Alzheimer's disease Mother    Diabetes Brother     Social History   Socioeconomic History   Marital status: Widowed    Spouse name: Gorden Stthomas   Number of children: 0   Years of education: 14   Highest education level: Associate degree: occupational, Scientist, product/process development, or vocational program  Occupational History   Not on file  Tobacco Use   Smoking status: Never    Passive exposure: Never   Smokeless tobacco: Never  Vaping Use   Vaping status: Never Used  Substance and Sexual Activity   Alcohol use: No   Drug use: No   Sexual activity: Not Currently    Partners: Female    Birth control/protection: None  Other Topics Concern   Not on file  Social History Narrative   Lives home alone. Goes to the church three times a week.       Social Determinants of Health   Financial Resource Strain: Low Risk  (10/30/2022)   Overall Financial Resource Strain (CARDIA)    Difficulty of Paying Living Expenses: Not hard at all  Food Insecurity: No Food Insecurity (10/30/2022)   Hunger Vital Sign    Worried About Running Out of Food in the Last Year: Never true    Ran Out of Food in the Last Year: Never true  Transportation Needs: No Transportation Needs  (10/30/2022)   PRAPARE - Administrator, Civil Service (Medical): No    Lack of Transportation (Non-Medical): No  Physical Activity: Sufficiently Active (10/30/2022)   Exercise Vital Sign    Days of Exercise per Week: 7 days    Minutes of Exercise per Session: 30 min  Stress: No Stress Concern Present (10/30/2022)   [employer] of Occupational Health - Occupational Stress Questionnaire    Feeling of Stress : Only a little  Social Connections: Moderately Isolated (10/30/2022)   Social Connection and Isolation Panel [NHANES]    Frequency of Communication with Friends and Family: More than three times a week    Frequency of Social Gatherings with Friends and Family: More than three times a week    Attends Religious Services: More than 4 times per year    Active Member of [employer] or Organizations: No    Attends Banker Meetings: Never    Marital Status: Widowed  Intimate Partner Violence: Not At Risk (10/30/2022)   Humiliation, Afraid, Rape, and Kick questionnaire    Fear of Current or Ex-Partner: No    Emotionally Abused: No    Physically Abused: No    Sexually Abused: No    Outpatient Medications Prior to  Visit  Medication Sig Dispense Refill   EPINEPHrine 0.3 mg/0.3 mL IJ SOAJ injection Inject 0.3 mg into the skin as directed. 2 each 1   rosuvastatin (CRESTOR) 10 MG tablet Take 1 tablet (10 mg total) by mouth daily. 90 tablet 3   tadalafil (CIALIS) 20 MG tablet Take 1 tablet (20 mg total) by mouth as needed for erectile dysfunction. Do not take more than one dose per day. 20 tablet 1   No facility-administered medications prior to visit.    Allergies  Allergen Reactions   Codeine Other (See Comments)    Other reaction(s): Other (See Comments) GI Upset  GI Upset  GI Upset      ROS Review of Systems  Constitutional:  Negative for fatigue and fever.  Eyes:  Negative for visual disturbance.  Respiratory:  Negative for chest tightness and shortness  of breath.   Cardiovascular:  Negative for chest pain and palpitations.  Neurological:  Negative for dizziness and headaches.      Objective:    Physical Exam HENT:     Head: Normocephalic.     Right Ear: External ear normal.     Left Ear: External ear normal.     Nose: No congestion or rhinorrhea.     Mouth/Throat:     Mouth: Mucous membranes are moist.  Cardiovascular:     Rate and Rhythm: Regular rhythm.     Heart sounds: No murmur heard. Pulmonary:     Effort: No respiratory distress.     Breath sounds: Normal breath sounds.  Neurological:     Mental Status: He is alert.     BP 126/77   Pulse (!) 52   Ht 5\' 10"  (1.778 m)   Wt 183 lb 1.9 oz (83.1 kg)   SpO2 96%   BMI 26.27 kg/m  Wt Readings from Last 3 Encounters:  11/07/22 183 lb 1.9 oz (83.1 kg)  10/30/22 180 lb (81.6 kg)  07/08/22 180 lb 0.6 oz (81.7 kg)    Lab Results  Component Value Date   TSH 1.710 11/07/2022   Lab Results  Component Value Date   WBC 7.4 11/07/2022   HGB 13.6 11/07/2022   HCT 41.9 11/07/2022   MCV 92 11/07/2022   PLT 176 11/07/2022   Lab Results  Component Value Date   NA 141 11/07/2022   K 4.3 11/07/2022   CO2 24 11/07/2022   GLUCOSE 99 11/07/2022   BUN 12 11/07/2022   CREATININE 1.40 (H) 11/07/2022   BILITOT 0.4 11/07/2022   ALKPHOS 70 11/07/2022   AST 14 11/07/2022   ALT 23 11/07/2022   PROT 6.5 11/07/2022   ALBUMIN 4.2 11/07/2022   CALCIUM 9.4 11/07/2022   ANIONGAP 8 10/02/2020   EGFR 54 (L) 11/07/2022   Lab Results  Component Value Date   CHOL 175 11/07/2022   Lab Results  Component Value Date   HDL 47 11/07/2022   Lab Results  Component Value Date   LDLCALC 108 (H) 11/07/2022   Lab Results  Component Value Date   TRIG 113 11/07/2022   Lab Results  Component Value Date   CHOLHDL 3.7 11/07/2022   Lab Results  Component Value Date   HGBA1C 5.9 (H) 11/07/2022      Assessment & Plan:  Grief counseling Assessment & Plan: Patient and/or legal  guardian verbally consented to [facility] services about presenting concerns and psychiatric consultation as appropriate.  The services will be billed as appropriate for the patient  Orders: -     Amb ref to Integrated Behavioral Health  Depression, major, single episode, mild (HCC) Assessment & Plan:  The patient reports not feeling like himself, experiencing symptoms of hopelessness, anhedonia, and feeling sad most days of the week since the loss of his wife. He also expresses feelings of regret for things he never did. The patient has been participating in group grief counseling but is interested in transitioning to one-on-one counseling. I will initiate therapy with Zoloft 25 mg daily to help manage his depression. He denies any suicidal thoughts or ideation. We reviewed nonpharmacological strategies for coping with depression, including mindfulness, meditation, and deep breathing exercises.   Orders: -     Sertraline HCl; Take 1 tablet (25 mg total) by mouth daily.  Dispense: 30 tablet; Refill: 3  Hypertriglyceridemia Assessment & Plan: Encouraged to continue taking rosuvastatin 10 mg daily  Here are some foods to avoid or reduce in your diet to help manage cholesterol levels:  Fried Foods:Deep-fried items such as french fries, fried chicken, and fried snacks are high in unhealthy fats and can raise LDL (bad) cholesterol levels. Processed Meats:Foods like bacon, sausage, hot dogs, and deli meats are often high in saturated fat and cholesterol. Full-Fat Dairy Products:Whole milk, full-fat yogurt, butter, cream, and cheese are rich in saturated fats, which can increase cholesterol levels. Baked Goods and Sweets:Pastries, cakes, cookies, and donuts often contain trans fats and added sugars, which can raise LDL cholesterol and lower HDL (good) cholesterol. Red Meat:Beef, lamb, and pork are high in saturated fat. Lean cuts or plant-based protein alternatives are  better options. Lard and Shortening:Used in some baked goods, lard and shortening are high in trans fats and should be avoided. Fast Food:Many fast food items are cooked with unhealthy oils and contain high amounts of saturated and trans fats. Processed Snacks:Chips, crackers, and certain microwave popcorns can contain trans fats and high levels of unhealthy oils. Shellfish:While nutritious in other ways, some shellfish like shrimp, lobster, and crab are high in cholesterol. They should be consumed in moderation. Coconut and Palm Oils:these oils are high in saturated fat and can raise cholesterol levels when used in cooking or baking.    Orders: -     Lipid panel -     CMP14+EGFR -     CBC with Differential/Platelet  IFG (impaired fasting glucose) -     Hemoglobin A1c  Vitamin D deficiency -     VITAMIN D 25 Hydroxy (Vit-D Deficiency, Fractures)  Other specified hypothyroidism -     TSH + free T4  Note: This chart has been completed using Engineer, civil (consulting) software, and while attempts have been made to ensure accuracy, certain words and phrases may not be transcribed as intended.    Follow-up: Return in about 6 weeks (around 12/19/2022).   Gilmore Laroche, FNP

## 2022-11-08 LAB — CBC WITH DIFFERENTIAL/PLATELET
Basophils Absolute: 0.1 10*3/uL (ref 0.0–0.2)
Basos: 1 %
EOS (ABSOLUTE): 0.1 10*3/uL (ref 0.0–0.4)
Eos: 2 %
Hematocrit: 41.9 % (ref 37.5–51.0)
Hemoglobin: 13.6 g/dL (ref 13.0–17.7)
Immature Grans (Abs): 0 10*3/uL (ref 0.0–0.1)
Immature Granulocytes: 0 %
Lymphocytes Absolute: 1.8 10*3/uL (ref 0.7–3.1)
Lymphs: 25 %
MCH: 29.7 pg (ref 26.6–33.0)
MCHC: 32.5 g/dL (ref 31.5–35.7)
MCV: 92 fL (ref 79–97)
Monocytes Absolute: 0.5 10*3/uL (ref 0.1–0.9)
Monocytes: 7 %
Neutrophils Absolute: 4.8 10*3/uL (ref 1.4–7.0)
Neutrophils: 65 %
Platelets: 176 10*3/uL (ref 150–450)
RBC: 4.58 x10E6/uL (ref 4.14–5.80)
RDW: 12.9 % (ref 11.6–15.4)
WBC: 7.4 10*3/uL (ref 3.4–10.8)

## 2022-11-08 LAB — CMP14+EGFR
ALT: 23 [IU]/L (ref 0–44)
AST: 14 [IU]/L (ref 0–40)
Albumin: 4.2 g/dL (ref 3.9–4.9)
Alkaline Phosphatase: 70 [IU]/L (ref 44–121)
BUN/Creatinine Ratio: 9 — ABNORMAL LOW (ref 10–24)
BUN: 12 mg/dL (ref 8–27)
Bilirubin Total: 0.4 mg/dL (ref 0.0–1.2)
CO2: 24 mmol/L (ref 20–29)
Calcium: 9.4 mg/dL (ref 8.6–10.2)
Chloride: 104 mmol/L (ref 96–106)
Creatinine, Ser: 1.4 mg/dL — ABNORMAL HIGH (ref 0.76–1.27)
Globulin, Total: 2.3 g/dL (ref 1.5–4.5)
Glucose: 99 mg/dL (ref 70–99)
Potassium: 4.3 mmol/L (ref 3.5–5.2)
Sodium: 141 mmol/L (ref 134–144)
Total Protein: 6.5 g/dL (ref 6.0–8.5)
eGFR: 54 mL/min/{1.73_m2} — ABNORMAL LOW (ref >59)

## 2022-11-08 LAB — LIPID PANEL
Chol/HDL Ratio: 3.7 {ratio} (ref 0.0–5.0)
Cholesterol, Total: 175 mg/dL (ref 100–199)
HDL: 47 mg/dL (ref >39)
LDL Chol Calc (NIH): 108 mg/dL — ABNORMAL HIGH (ref 0–99)
Triglycerides: 113 mg/dL (ref 0–149)
VLDL Cholesterol Cal: 20 mg/dL (ref 5–40)

## 2022-11-08 LAB — TSH+FREE T4
Free T4: 1.29 ng/dL (ref 0.82–1.77)
TSH: 1.71 u[IU]/mL (ref 0.450–4.500)

## 2022-11-08 LAB — HEMOGLOBIN A1C
Est. average glucose Bld gHb Est-mCnc: 123 mg/dL
Hgb A1c MFr Bld: 5.9 % — ABNORMAL HIGH (ref 4.8–5.6)

## 2022-11-08 LAB — VITAMIN D 25 HYDROXY (VIT D DEFICIENCY, FRACTURES): Vit D, 25-Hydroxy: 55.2 ng/mL (ref 30.0–100.0)

## 2022-11-09 DIAGNOSIS — F320 Major depressive disorder, single episode, mild: Secondary | ICD-10-CM | POA: Insufficient documentation

## 2022-11-09 NOTE — Assessment & Plan Note (Signed)
Patient and/or legal guardian verbally consented to [facility] services about presenting concerns and psychiatric consultation as appropriate.  The services will be billed as appropriate for the patient.

## 2022-11-09 NOTE — Assessment & Plan Note (Addendum)
Encouraged to continue taking rosuvastatin 10 mg daily  Here are some foods to avoid or reduce in your diet to help manage cholesterol levels:  Fried Foods:Deep-fried items such as french fries, fried chicken, and fried snacks are high in unhealthy fats and can raise LDL (bad) cholesterol levels. Processed Meats:Foods like bacon, sausage, hot dogs, and deli meats are often high in saturated fat and cholesterol. Full-Fat Dairy Products:Whole milk, full-fat yogurt, butter, cream, and cheese are rich in saturated fats, which can increase cholesterol levels. Baked Goods and Sweets:Pastries, cakes, cookies, and donuts often contain trans fats and added sugars, which can raise LDL cholesterol and lower HDL (good) cholesterol. Red Meat:Beef, lamb, and pork are high in saturated fat. Lean cuts or plant-based protein alternatives are better options. Lard and Shortening:Used in some baked goods, lard and shortening are high in trans fats and should be avoided. Fast Food:Many fast food items are cooked with unhealthy oils and contain high amounts of saturated and trans fats. Processed Snacks:Chips, crackers, and certain microwave popcorns can contain trans fats and high levels of unhealthy oils. Shellfish:While nutritious in other ways, some shellfish like shrimp, lobster, and crab are high in cholesterol. They should be consumed in moderation. Coconut and Palm Oils:these oils are high in saturated fat and can raise cholesterol levels when used in cooking or baking.

## 2022-11-09 NOTE — Assessment & Plan Note (Signed)
  The patient reports not feeling like himself, experiencing symptoms of hopelessness, anhedonia, and feeling sad most days of the week since the loss of his wife. He also expresses feelings of regret for things he never did. The patient has been participating in group grief counseling but is interested in transitioning to one-on-one counseling. I will initiate therapy with Zoloft 25 mg daily to help manage his depression. He denies any suicidal thoughts or ideation. We reviewed nonpharmacological strategies for coping with depression, including mindfulness, meditation, and deep breathing exercises.

## 2022-11-13 NOTE — Progress Notes (Signed)
  Please inform the patient that his lab results indicate he is prediabetic, and I recommend decreasing his intake of high-sugar foods and beverages while increasing physical activity. Additionally, congratulate him on successfully lowering his LDL cholesterol from 155 to 108. I aim for his LDL cholesterol to be less than 100, so please encourage him to keep up the good efforts. I also recommend reducing his intake of greasy, fatty, and starchy foods, along with continued physical activity. All other lab results are stable.

## 2022-12-20 ENCOUNTER — Encounter: Payer: Self-pay | Admitting: Family Medicine

## 2022-12-20 ENCOUNTER — Ambulatory Visit (INDEPENDENT_AMBULATORY_CARE_PROVIDER_SITE_OTHER): Payer: Medicare Other | Admitting: Family Medicine

## 2022-12-20 VITALS — BP 127/81 | HR 65 | Ht 70.0 in | Wt 182.1 lb

## 2022-12-20 DIAGNOSIS — F320 Major depressive disorder, single episode, mild: Secondary | ICD-10-CM

## 2022-12-20 DIAGNOSIS — Z7189 Other specified counseling: Secondary | ICD-10-CM | POA: Diagnosis not present

## 2022-12-20 MED ORDER — SERTRALINE HCL 50 MG PO TABS: 50.0000 mg | ORAL_TABLET | Freq: Every day | ORAL | 3 refills | Status: DC

## 2022-12-20 NOTE — Patient Instructions (Signed)
I appreciate the opportunity to provide care to you today!    Follow up:  6 weeks   Start taking zoloft 50 mg daily Take at the same time each day, with or without food May take at least 2 weeks for benefit, and 6-8 weeks for full effect Do not abruptly discontinue to avoid withdrawal symptoms  Nonpharmacologic management of  depression  Mindfulness and Meditation Practices like mindfulness meditation can help reduce symptoms by promoting relaxation and present-moment awareness.  Exercise  Regular physical activity has been shown to improve mood and reduce anxiety through the release of endorphins and other neurochemicals.  Healthy Diet Eating a balanced diet rich in fruits, vegetables, whole grains, and lean proteins can support overall mental health.  Sleep Hygiene  Establishing a regular sleep routine and ensuring good sleep quality can significantly impact mood and anxiety levels.  Stress Management Techniques Activities such as yoga, tai chi, and deep breathing exercises can help manage stress.  Social Support Maintaining strong relationships and seeking support from friends, family, or support groups can provide emotional comfort and reduce feelings of isolation.  Lifestyle Modifications Reducing alcohol and caffeine intake, quitting smoking, and avoiding recreational drugs can improve symptoms.  Art and Music Therapy Engaging in creative activities like painting, drawing, or playing music can be therapeutic and help express emotions.  Light Therapy Particularly useful for seasonal affective disorder (SAD), exposure to bright light can help regulate mood. .    Please continue to a heart-healthy diet and increase your physical activities. Try to exercise for at least five days a week.    It was a pleasure to see you and I look forward to continuing to work together on your health and well-being. Please do not hesitate to call the office if you need care or have  questions about your care.  In case of emergency, please visit the Emergency Department for urgent care, or contact our clinic at [phone] to schedule an appointment. We're here to help you!   Have a wonderful day and week. With Gratitude, Gilmore Laroche MSN, FNP-BC

## 2022-12-20 NOTE — Progress Notes (Signed)
Established Patient Office Visit  Subjective:  Patient ID: Russell Mckinney, male    DOB: 10-30-1950  Age: 72 y.o. MRN: [id number]  CC:  Chief Complaint  Patient presents with   Care Management    6 week f/u, has counseling appt scheduled for 12/03. Reports sx about the same.     HPI Russell Mckinney is a 72 y.o. male presents for depression/grief counseling f/u. For the details of today's visit, please refer to the assessment and plan.    Past Medical History:  Diagnosis Date   Diverticulitis    2017   High cholesterol    High cholesterol     Past Surgical History:  Procedure Laterality Date   TONSILLECTOMY      Family History  Problem Relation Age of Onset   Alzheimer's disease Mother    Diabetes Brother     Social History   Socioeconomic History   Marital status: Widowed    Spouse name: Russell Mckinney   Number of children: 0   Years of education: 14   Highest education level: Associate degree: occupational, Scientist, product/process development, or vocational program  Occupational History   Not on file  Tobacco Use   Smoking status: Never    Passive exposure: Never   Smokeless tobacco: Never  Vaping Use   Vaping status: Never Used  Substance and Sexual Activity   Alcohol use: No   Drug use: No   Sexual activity: Not Currently    Partners: Female    Birth control/protection: None  Other Topics Concern   Not on file  Social History Narrative   Lives home alone. Goes to the church three times a week.       Social Determinants of Health   Financial Resource Strain: Low Risk  (10/30/2022)   Overall Financial Resource Strain (CARDIA)    Difficulty of Paying Living Expenses: Not hard at all  Food Insecurity: No Food Insecurity (10/30/2022)   Hunger Vital Sign    Worried About Running Out of Food in the Last Year: Never true    Ran Out of Food in the Last Year: Never true  Transportation Needs: No Transportation Needs (10/30/2022)   PRAPARE - Administrator, Civil Service  (Medical): No    Lack of Transportation (Non-Medical): No  Physical Activity: Sufficiently Active (10/30/2022)   Exercise Vital Sign    Days of Exercise per Week: 7 days    Minutes of Exercise per Session: 30 min  Stress: No Stress Concern Present (10/30/2022)   [employer] of Occupational Health - Occupational Stress Questionnaire    Feeling of Stress : Only a little  Social Connections: Moderately Isolated (10/30/2022)   Social Connection and Isolation Panel [NHANES]    Frequency of Communication with Friends and Family: More than three times a week    Frequency of Social Gatherings with Friends and Family: More than three times a week    Attends Religious Services: More than 4 times per year    Active Member of [employer] or Organizations: No    Attends Banker Meetings: Never    Marital Status: Widowed  Intimate Partner Violence: Not At Risk (10/30/2022)   Humiliation, Afraid, Rape, and Kick questionnaire    Fear of Current or Ex-Partner: No    Emotionally Abused: No    Physically Abused: No    Sexually Abused: No    Outpatient Medications Prior to Visit  Medication Sig Dispense Refill   EPINEPHrine 0.3 mg/0.3 mL IJ SOAJ  injection Inject 0.3 mg into the skin as directed. 2 each 1   rosuvastatin (CRESTOR) 10 MG tablet Take 1 tablet (10 mg total) by mouth daily. 90 tablet 3   tadalafil (CIALIS) 20 MG tablet Take 1 tablet (20 mg total) by mouth as needed for erectile dysfunction. Do not take more than one dose per day. 20 tablet 1   sertraline (ZOLOFT) 25 MG tablet Take 1 tablet (25 mg total) by mouth daily. 30 tablet 3   No facility-administered medications prior to visit.    Allergies  Allergen Reactions   Codeine Other (See Comments)    Other reaction(s): Other (See Comments) GI Upset  GI Upset  GI Upset      ROS Review of Systems  Constitutional:  Negative for fatigue and fever.  Eyes:  Negative for visual disturbance.  Respiratory:  Negative for chest  tightness and shortness of breath.   Cardiovascular:  Negative for chest pain and palpitations.  Neurological:  Negative for dizziness and headaches.      Objective:    Physical Exam HENT:     Head: Normocephalic.     Right Ear: External ear normal.     Left Ear: External ear normal.     Nose: No congestion or rhinorrhea.     Mouth/Throat:     Mouth: Mucous membranes are moist.  Cardiovascular:     Rate and Rhythm: Regular rhythm.     Heart sounds: No murmur heard. Pulmonary:     Effort: No respiratory distress.     Breath sounds: Normal breath sounds.  Neurological:     Mental Status: He is alert.  Psychiatric:        Mood and Affect: Affect is tearful.     BP 127/81   Pulse 65   Ht 5\' 10"  (1.778 m)   Wt 182 lb 1.9 oz (82.6 kg)   SpO2 94%   BMI 26.13 kg/m  Wt Readings from Last 3 Encounters:  12/20/22 182 lb 1.9 oz (82.6 kg)  11/07/22 183 lb 1.9 oz (83.1 kg)  10/30/22 180 lb (81.6 kg)    Lab Results  Component Value Date   TSH 1.710 11/07/2022   Lab Results  Component Value Date   WBC 7.4 11/07/2022   HGB 13.6 11/07/2022   HCT 41.9 11/07/2022   MCV 92 11/07/2022   PLT 176 11/07/2022   Lab Results  Component Value Date   NA 141 11/07/2022   K 4.3 11/07/2022   CO2 24 11/07/2022   GLUCOSE 99 11/07/2022   BUN 12 11/07/2022   CREATININE 1.40 (H) 11/07/2022   BILITOT 0.4 11/07/2022   ALKPHOS 70 11/07/2022   AST 14 11/07/2022   ALT 23 11/07/2022   PROT 6.5 11/07/2022   ALBUMIN 4.2 11/07/2022   CALCIUM 9.4 11/07/2022   ANIONGAP 8 10/02/2020   EGFR 54 (L) 11/07/2022   Lab Results  Component Value Date   CHOL 175 11/07/2022   Lab Results  Component Value Date   HDL 47 11/07/2022   Lab Results  Component Value Date   LDLCALC 108 (H) 11/07/2022   Lab Results  Component Value Date   TRIG 113 11/07/2022   Lab Results  Component Value Date   CHOLHDL 3.7 11/07/2022   Lab Results  Component Value Date   HGBA1C 5.9 (H) 11/07/2022       Assessment & Plan:  Depression, major, single episode, mild (HCC) Assessment & Plan: PHQ-9 score: 7 (indicating mild depression). GAD-7 score: 2 (  indicating mild anxiety). The patient reports experiencing good days and bad days and is currently following the prescribed treatment regimen. There are no reports of suicidal thoughts or ideation. The patient admits to feeling some relief with Zoloft (sertraline) 25 mg daily. The dose of Zoloft will be increased to 50 mg daily. The patient is encouraged to follow up with integrated behavioral health for cognitive behavioral therapy (CBT) to further support mental health management.   Orders: -     Sertraline HCl; Take 1 tablet (50 mg total) by mouth daily.  Dispense: 30 tablet; Refill: 3  Grief counseling Assessment & Plan: The patient will follow up with integrated behavioral health on January 10, 2023. He remains very active in group grief counseling but continues to struggle with feelings of regret, particularly regarding the belief that he let his wife down. The patient is tearful as he ruminates on what he could have done better, but he also reports that he is no longer focused on the difficult days in his marriage. He shares that his faith and involvement in his church have been significant sources of support during this process. Encouraged the patient to stay active in his church and to hold onto the positive memories of his marriage with his wife.    Note: This chart has been completed using Engineer, civil (consulting) software, and while attempts have been made to ensure accuracy, certain words and phrases may not be transcribed as intended.    Follow-up: Return in about 6 weeks (around 01/31/2023).   Gilmore Laroche, FNP

## 2022-12-20 NOTE — Assessment & Plan Note (Signed)
PHQ-9 score: 7 (indicating mild depression). GAD-7 score: 2 (indicating mild anxiety). The patient reports experiencing good days and bad days and is currently following the prescribed treatment regimen. There are no reports of suicidal thoughts or ideation. The patient admits to feeling some relief with Zoloft (sertraline) 25 mg daily. The dose of Zoloft will be increased to 50 mg daily. The patient is encouraged to follow up with integrated behavioral health for cognitive behavioral therapy (CBT) to further support mental health management.

## 2022-12-20 NOTE — Assessment & Plan Note (Signed)
The patient will follow up with integrated behavioral health on January 10, 2023. He remains very active in group grief counseling but continues to struggle with feelings of regret, particularly regarding the belief that he let his wife down. The patient is tearful as he ruminates on what he could have done better, but he also reports that he is no longer focused on the difficult days in his marriage. He shares that his faith and involvement in his church have been significant sources of support during this process. Encouraged the patient to stay active in his church and to hold onto the positive memories of his marriage with his wife.

## 2023-01-06 ENCOUNTER — Ambulatory Visit: Payer: Medicare Other | Attending: Family Medicine | Admitting: Audiologist

## 2023-01-06 DIAGNOSIS — H903 Sensorineural hearing loss, bilateral: Secondary | ICD-10-CM | POA: Diagnosis not present

## 2023-01-06 DIAGNOSIS — H9313 Tinnitus, bilateral: Secondary | ICD-10-CM | POA: Insufficient documentation

## 2023-01-06 NOTE — Procedures (Signed)
  Outpatient Audiology and [facility] [address] [place], [place]  [address] [phone]  AUDIOLOGICAL  EVALUATION  NAME: Russell Mckinney     DOB:   10/13/1950      MRN: [id number]                                                                                     DATE: 01/06/2023     REFERENT: Gilmore Laroche, FNP STATUS: Outpatient DIAGNOSIS: Sensorineural hearing loss bilateral  History: [name] was seen for an audiological evaluation due to ringing in both ears that is gotten worse over the last 6 months.[name] denies pain, pressure, or dizziness. Has tinnitus sounding like a high pitched squeel bilaterally.  He has had hearing tests through [employer] before he retired that showed decreased high-pitched hearing.  He was never sent by his employer for a full diagnostic eval [name] has history of hazardous noise exposure from work. Medical history shows no medical condition with risk for hearing loss.   Evaluation:  Otoscopy showed a clear view of the tympanic membranes, bilaterally Tympanometry results were consistent with normal middle ear function bilaterally Audiometric testing was completed using Conventional Audiometry techniques with insert earphones and supraural headphones. Test results are consistent with normal hearing 250 through 1 kHz sloping to moderately severe sensorineural hearing loss bilaterally. Speech Recognition Thresholds were obtained at 40 dB HL in the right ear and at 30 dB HL in the left ear. Word Recognition Testing was completed at 80 dB in each ear with masking. [name] scored 100% in the right ear and 84% in the left ear.  Results:  The test results were reviewed with [name].  He has a sloping sensorineural hearing loss bilaterally that is consistent with age and exposure to hazardous noise.  He was counseled at length about the relationship between tinnitus and hearing loss.  Hearing aids are recommended for both years, to help improve clarity of speech and  help mitigate impact of tinnitus.  [name] not interested in hearing aids at this time. Audiogram printed and provided to [employer].    Recommendations: Hearing aids recommended for both ears once ready for daily use. Patient given list of local hearing aid providers.  Annual audiometric testing recommended to monitor hearing loss for progression.    32 minutes spent testing and counseling on results.   If you have any questions please feel free to contact me at [phone].  Ammie Ferrier Au.D.  Audiologist   01/06/2023  3:00 PM  Cc: Gilmore Laroche, FNP

## 2023-01-10 ENCOUNTER — Ambulatory Visit: Payer: Medicare Other | Admitting: Professional Counselor

## 2023-01-10 DIAGNOSIS — F4329 Adjustment disorder with other symptoms: Secondary | ICD-10-CM

## 2023-01-10 NOTE — BH Specialist Note (Signed)
Collaborative Care Initial Assessment  Session Start time: 11:00 am   Session End time: 12:00 am  Total time in minutes: 60 min   Type of Contact:   Patient consent obtained:  Yes or No Types of Service: Collaborative care  Summary  Patient is a 72 yo male being referred to collaborative care by  pcp for anxiety and depression. Patient was engaged and cooperative during session.   Reason for referral in patient/family's own words:  "I still cry daily over my wife's passing"  Patient's goal for today's visit: "Dig a little deeper"  History of Present illness:   The patient is a 72 year old male with no prior history of behavioral health concerns, presenting for a collaborative care assessment. His chief complaint is prolonged grief following the loss of his wife three years ago. He reports daily crying, persistent sadness, and a profound sense of loneliness, accompanied by an inability to move forward with his life. These feelings have persisted since her passing.  The patient participated in a grief-share group for two years but feels he has reached the limits of its benefits. While he maintains stable day-to-day functioning, he remains deeply affected by reminders of his wife, often talking about her and experiencing emotional distress when confronted with memories of her. He has attempted to date but has been unsuccessful, which has reinforced his feelings of isolation.  The patient finds solace in his involvement with his church and volunteer work, which he describes as helpful. 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 The transition from a structured schedule and a purposeful career to a life without his partner has been particularly challenging. He is exploring options such as finding a part-time job, getting involved in activities at a senior center, and seeking companionship through  dating to address his loneliness, which he identifies as his most significant trigger.  He is currently being treated with Zoloft, recently titrated to 50 mg, but has not noticed any significant improvement. He denies suicidal ideation, self-harm, or thoughts of harm to others. He also denies any psychiatric or substance abuse history, describing his life before his wife's passing as fulfilling and stable.  The patient is seeking supportive counseling to process his grief, build motivation, and work towards reconnecting with others and finding new sources of purpose and fulfillment. A follow-up will be scheduled to monitor his progress and evaluate his treatment plan.  Clinical Assessment   PHQ-9 Assessments:    01/10/2023   11:10 AM 12/20/2022   10:29 AM 12/20/2022   10:22 AM 11/07/2022    8:48 AM 10/30/2022   10:41 AM  Depression screen PHQ 2/9  Decreased Interest 1 0 0 1 0  Down, Depressed, Hopeless 1 0 0 1 0  PHQ - 2 Score 2 0 0 2 0  Altered sleeping 1 0 0 2   Tired, decreased energy 1 0 0 2   Change in appetite 1 0 0 2   Feeling bad or failure about yourself  1 0 0 1   Trouble concentrating 1 0 0 0   Moving slowly or fidgety/restless 0 0 0 0   Suicidal thoughts 0 0 0 0   PHQ-9 Score 7 0 0 9   Difficult doing work/chores Somewhat difficult Not difficult at all Not difficult at all Somewhat difficult     GAD-7 Assessments:    01/10/2023   11:17 AM 12/20/2022   10:29  AM 12/20/2022   10:22 AM 11/07/2022    8:49 AM  GAD 7 : Generalized Anxiety Score  Nervous, Anxious, on Edge 0 0 0 1  Control/stop worrying 1 0 0 0  Worry too much - different things 1 0 0 0  Trouble relaxing 0 0 0 0  Restless 0 0 0 0  Easily annoyed or irritable 0 0 0 0  Afraid - awful might happen 0 0 0 0  Total GAD 7 Score 2 0 0 1  Anxiety Difficulty Somewhat difficult Not difficult at all  Not difficult at all     Social History:  Household: Lives alone Marital status:  Number of Children:   Employment: Recently retired  Education: Scientist, research (physical sciences)  Psychiatric Review of systems: Insomnia: No Changes in appetite: No Decreased need for sleep: No Family history of bipolar disorder: No Hallucinations: No   Paranoia: No    Psychotropic medications: Current medications: Zoloft 50 mg Patient taking medications as prescribed:  Yes Side effects reported: No  Current medications (medication list) Current Outpatient Medications on File Prior to Visit  Medication Sig Dispense Refill   EPINEPHrine 0.3 mg/0.3 mL IJ SOAJ injection Inject 0.3 mg into the skin as directed. 2 each 1   rosuvastatin (CRESTOR) 10 MG tablet Take 1 tablet (10 mg total) by mouth daily. 90 tablet 3   sertraline (ZOLOFT) 50 MG tablet Take 1 tablet (50 mg total) by mouth daily. 30 tablet 3   tadalafil (CIALIS) 20 MG tablet Take 1 tablet (20 mg total) by mouth as needed for erectile dysfunction. Do not take more than one dose per day. 20 tablet 1   No current facility-administered medications on file prior to visit.    Psychiatric History: Past psychiatry diagnosis: None Patient currently being seen by therapist/psychiatrist: No  Prior Suicide Attempts: No Past psychiatry Hospitalization(s): No Past history of violence: No  Traumatic Experiences: History or current traumatic events (natural disaster, house fire, etc.)? no History or current physical trauma?  no History or current emotional trauma?  no History or current sexual trauma?  no History or current domestic or intimate partner violence?  no PTSD symptoms if any traumatic experiences no  Alcohol and/or Substance Use History   Tobacco Alcohol Other substances  Current use Never Never Never  Past use     Past treatment      Withdrawal Potential: no  Self-harm Behaviors Risk Assessment Self-harm risk factors:   Patient endorses recent thoughts of harming self:   [place] Suicide Severity Rating Scale:   Guns in the home:  no     Protective factors: Hopefulness  Danger to Others Risk Assessment Danger to others risk factors:  None Patient endorses recent thoughts of harming others:  Denies Dynamic Appraisal of Situational Aggression (DASA):   BH Counselor discussed emergency crisis plan with client and provided local emergency services resources.  Mental status exam:   General Appearance Luretha Murphy:  Casual Eye Contact:  Good Motor Behavior:  Normal Speech:  Normal Level of Consciousness:  Alert Mood:  Depressed Affect:  Depressed Anxiety Level:  None Thought Process:  Coherent Thought Content:  WNL Perception:  Normal Judgment:  Good Insight:  Present  Diagnosis: Grief reaction with prolonged bereavement    Goals: Increase healthy adjustment to current life circumstances   Interventions: Behavioral Activation and Supportive Counseling   Follow-up Plan: Bi-weekly motivational interviewing

## 2023-01-10 NOTE — Patient Instructions (Signed)
If your symptoms worsen or you have thoughts of suicide/homicide, PLEASE SEEK IMMEDIATE MEDICAL ATTENTION.  You may always call:   National Suicide Hotline: 988 or 800-273-8255 [place] Crisis Line: 336-832-9700 Crisis Recovery in [place]: 800-939-5911      These are available 24 hours a day, 7 days a week.  

## 2023-01-17 ENCOUNTER — Telehealth (INDEPENDENT_AMBULATORY_CARE_PROVIDER_SITE_OTHER): Payer: Medicare Other | Admitting: Professional Counselor

## 2023-01-17 DIAGNOSIS — F4329 Adjustment disorder with other symptoms: Secondary | ICD-10-CM | POA: Diagnosis not present

## 2023-01-17 NOTE — BH Specialist Note (Signed)
Virtual Behavioral Health Treatment Plan Team Note  MRN: [id number] NAME: Russell Mckinney  DATE: 01/17/23  Start time: Start Time: 0135 End time: Stop Time: 0140 Total time: Total Time in Minutes (Visit): 5  Total number of Virtual BH Treatment Team Plan encounters: 1/4  Treatment Team Attendees: Dr. Lolly Mustache and Esmond Harps  Diagnoses:    ICD-10-CM   1. Grief reaction with prolonged bereavement  F43.29       Goals, Interventions and Follow-up Plan Goals: Increase healthy adjustment to current life circumstances Interventions: Behavioral Activation Supportive Counseling Medication Management Recommendations: Continue current regimine Follow-up Plan:  B I-weekly supportive counseling  History of the present illness Presenting Problem/Current Symptoms:  The patient is a 71 year old male with no prior history of behavioral health concerns, presenting for a collaborative care assessment. His chief complaint is prolonged grief following the loss of his wife three years ago. He reports daily crying, persistent sadness, and a profound sense of loneliness, accompanied by an inability to move forward with his life. These feelings have persisted since her passing.   The patient participated in a grief-share group for two years but feels he has reached the limits of its benefits. While he maintains stable day-to-day functioning, he remains deeply affected by reminders of his wife, often talking about her and experiencing emotional distress when confronted with memories of her. He has attempted to date but has been unsuccessful, which has reinforced his feelings of isolation.   The patient finds solace in his involvement with his church and volunteer work, which he describes as helpful. 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 The transition from a structured schedule and a purposeful career to a life  without his partner has been particularly challenging. He is exploring options such as finding a part-time job, getting involved in activities at a senior center, and seeking companionship through dating to address his loneliness, which he identifies as his most significant trigger.   He is currently being treated with Zoloft, recently titrated to 50 mg, but has not noticed any significant improvement. He denies suicidal ideation, self-harm, or thoughts of harm to others. He also denies any psychiatric or substance abuse history, describing his life before his wife's passing as fulfilling and stable.   The patient is seeking supportive counseling to process his grief, build motivation, and work towards reconnecting with others and finding new sources of purpose and fulfillment. A follow-up will be scheduled to monitor his progress and evaluate his treatment plan.  Screenings PHQ-9 Assessments:     01/10/2023   11:10 AM 12/20/2022   10:29 AM 12/20/2022   10:22 AM  Depression screen PHQ 2/9  Decreased Interest 1 0 0  Down, Depressed, Hopeless 1 0 0  PHQ - 2 Score 2 0 0  Altered sleeping 1 0 0  Tired, decreased energy 1 0 0  Change in appetite 1 0 0  Feeling bad or failure about yourself  1 0 0  Trouble concentrating 1 0 0  Moving slowly or fidgety/restless 0 0 0  Suicidal thoughts 0 0 0  PHQ-9 Score 7 0 0  Difficult doing work/chores Somewhat difficult Not difficult at all Not difficult at all   GAD-7 Assessments:     01/10/2023   11:17 AM 12/20/2022   10:29 AM 12/20/2022   10:22 AM 11/07/2022    8:49 AM  GAD 7 : Generalized Anxiety Score  Nervous, Anxious,  on Edge 0 0 0 1  Control/stop worrying 1 0 0 0  Worry too much - different things 1 0 0 0  Trouble relaxing 0 0 0 0  Restless 0 0 0 0  Easily annoyed or irritable 0 0 0 0  Afraid - awful might happen 0 0 0 0  Total GAD 7 Score 2 0 0 1  Anxiety Difficulty Somewhat difficult Not difficult at all  Not difficult at all    Past  Medical History Past Medical History:  Diagnosis Date   Diverticulitis    2017   High cholesterol    High cholesterol     Vital signs: There were no vitals filed for this visit.  Allergies:  Allergies as of 01/17/2023 - Review Complete 12/20/2022  Allergen Reaction Noted   Codeine Other (See Comments) 08/11/2015    Medication History Current medications:  Outpatient Encounter Medications as of 01/17/2023  Medication Sig   EPINEPHrine 0.3 mg/0.3 mL IJ SOAJ injection Inject 0.3 mg into the skin as directed.   rosuvastatin (CRESTOR) 10 MG tablet Take 1 tablet (10 mg total) by mouth daily.   sertraline (ZOLOFT) 50 MG tablet Take 1 tablet (50 mg total) by mouth daily.   tadalafil (CIALIS) 20 MG tablet Take 1 tablet (20 mg total) by mouth as needed for erectile dysfunction. Do not take more than one dose per day.   No facility-administered encounter medications on file as of 01/17/2023.     Scribe for Treatment Team: Reuel Boom

## 2023-01-23 ENCOUNTER — Encounter: Payer: Medicare Other | Admitting: Professional Counselor

## 2023-02-07 ENCOUNTER — Encounter: Payer: Self-pay | Admitting: Family Medicine

## 2023-02-07 ENCOUNTER — Ambulatory Visit (INDEPENDENT_AMBULATORY_CARE_PROVIDER_SITE_OTHER): Payer: Medicare Other | Admitting: Family Medicine

## 2023-02-07 VITALS — BP 126/64 | HR 64 | Ht 70.0 in | Wt 188.1 lb

## 2023-02-07 DIAGNOSIS — F320 Major depressive disorder, single episode, mild: Secondary | ICD-10-CM | POA: Diagnosis not present

## 2023-02-07 DIAGNOSIS — R251 Tremor, unspecified: Secondary | ICD-10-CM | POA: Diagnosis not present

## 2023-02-07 NOTE — Patient Instructions (Signed)
 I appreciate the opportunity to provide care to you today!    Follow up:  3 months  Referrals today-  neurology   Please continue to a heart-healthy diet and increase your physical activities. Try to exercise for at least five days a week.    It was a pleasure to see you and I look forward to continuing to work together on your health and well-being. Please do not hesitate to call the office if you need care or have questions about your care.  In case of emergency, please visit the Emergency Department for urgent care, or contact our clinic at [phone] to schedule an appointment. We're here to help you!   Have a wonderful day and week. With Gratitude, Pratik Dalziel MSN, FNP-BC

## 2023-02-07 NOTE — Progress Notes (Signed)
 Established Patient Office Visit  Subjective:  Patient ID: Russell Mckinney, male    DOB: Feb 24, 1950  Age: 73 y.o. MRN: [id number]  CC:  Chief Complaint  Patient presents with   Care Management    6 week f/u    HPI Russell Mckinney is a 73 y.o. male resents for major depression due to the loss of his wifef/u with complaints of rest tremors. For the details of today's visit, please refer to the assessment and plan.     Past Medical History:  Diagnosis Date   Diverticulitis    2017   High cholesterol    High cholesterol     Past Surgical History:  Procedure Laterality Date   TONSILLECTOMY      Family History  Problem Relation Age of Onset   Alzheimer's disease Mother    Diabetes Brother     Social History   Socioeconomic History   Marital status: Widowed    Spouse name: [name]   Number of children: 0   Years of education: 14   Highest education level: Associate degree: occupational, scientist, product/process development, or vocational program  Occupational History   Not on file  Tobacco Use   Smoking status: Never    Passive exposure: Never   Smokeless tobacco: Never  Vaping Use   Vaping status: Never Used  Substance and Sexual Activity   Alcohol use: No   Drug use: No   Sexual activity: Not Currently    Partners: Female    Birth control/protection: None  Other Topics Concern   Not on file  Social History Narrative   Lives home alone. Goes to the church three times a week.       Social Drivers of Corporate Investment Banker Strain: Low Risk  (10/30/2022)   Overall Financial Resource Strain (CARDIA)    Difficulty of Paying Living Expenses: Not hard at all  Food Insecurity: No Food Insecurity (10/30/2022)   Hunger Vital Sign    Worried About Running Out of Food in the Last Year: Never true    Ran Out of Food in the Last Year: Never true  Transportation Needs: No Transportation Needs (10/30/2022)   PRAPARE - Administrator, Civil Service (Medical): No    Lack of  Transportation (Non-Medical): No  Physical Activity: Sufficiently Active (10/30/2022)   Exercise Vital Sign    Days of Exercise per Week: 7 days    Minutes of Exercise per Session: 30 min  Stress: No Stress Concern Present (10/30/2022)   [employer] of Occupational Health - Occupational Stress Questionnaire    Feeling of Stress : Only a little  Social Connections: Moderately Isolated (10/30/2022)   Social Connection and Isolation Panel [NHANES]    Frequency of Communication with Friends and Family: More than three times a week    Frequency of Social Gatherings with Friends and Family: More than three times a week    Attends Religious Services: More than 4 times per year    Active Member of [employer] or Organizations: No    Attends Banker Meetings: Never    Marital Status: Widowed  Intimate Partner Violence: Not At Risk (10/30/2022)   Humiliation, Afraid, Rape, and Kick questionnaire    Fear of Current or Ex-Partner: No    Emotionally Abused: No    Physically Abused: No    Sexually Abused: No    Outpatient Medications Prior to Visit  Medication Sig Dispense Refill   EPINEPHrine  0.3 mg/0.3 mL IJ SOAJ  injection Inject 0.3 mg into the skin as directed. 2 each 1   rosuvastatin  (CRESTOR ) 10 MG tablet Take 1 tablet (10 mg total) by mouth daily. 90 tablet 3   sertraline  (ZOLOFT ) 50 MG tablet Take 1 tablet (50 mg total) by mouth daily. 30 tablet 3   tadalafil  (CIALIS ) 20 MG tablet Take 1 tablet (20 mg total) by mouth as needed for erectile dysfunction. Do not take more than one dose per day. 20 tablet 1   No facility-administered medications prior to visit.    Allergies  Allergen Reactions   Codeine Other (See Comments)    Other reaction(s): Other (See Comments) GI Upset  GI Upset  GI Upset      ROS Review of Systems  Constitutional:  Negative for fatigue and fever.  Eyes:  Negative for visual disturbance.  Respiratory:  Negative for chest tightness and shortness of  breath.   Cardiovascular:  Negative for chest pain and palpitations.  Neurological:  Negative for dizziness and headaches.      Objective:    Physical Exam HENT:     Head: Normocephalic.     Right Ear: External ear normal.     Left Ear: External ear normal.     Nose: No congestion or rhinorrhea.     Mouth/Throat:     Mouth: Mucous membranes are moist.  Cardiovascular:     Rate and Rhythm: Regular rhythm.     Heart sounds: No murmur heard. Pulmonary:     Effort: No respiratory distress.     Breath sounds: Normal breath sounds.  Neurological:     Mental Status: He is alert.     BP 126/64   Pulse 64   Ht 5' 10 (1.778 m)   Wt 188 lb 1.3 oz (85.3 kg)   SpO2 96%   BMI 26.99 kg/m  Wt Readings from Last 3 Encounters:  02/07/23 188 lb 1.3 oz (85.3 kg)  12/20/22 182 lb 1.9 oz (82.6 kg)  11/07/22 183 lb 1.9 oz (83.1 kg)    Lab Results  Component Value Date   TSH 1.710 11/07/2022   Lab Results  Component Value Date   WBC 7.4 11/07/2022   HGB 13.6 11/07/2022   HCT 41.9 11/07/2022   MCV 92 11/07/2022   PLT 176 11/07/2022   Lab Results  Component Value Date   NA 141 11/07/2022   K 4.3 11/07/2022   CO2 24 11/07/2022   GLUCOSE 99 11/07/2022   BUN 12 11/07/2022   CREATININE 1.40 (H) 11/07/2022   BILITOT 0.4 11/07/2022   ALKPHOS 70 11/07/2022   AST 14 11/07/2022   ALT 23 11/07/2022   PROT 6.5 11/07/2022   ALBUMIN 4.2 11/07/2022   CALCIUM  9.4 11/07/2022   ANIONGAP 8 10/02/2020   EGFR 54 (L) 11/07/2022   Lab Results  Component Value Date   CHOL 175 11/07/2022   Lab Results  Component Value Date   HDL 47 11/07/2022   Lab Results  Component Value Date   LDLCALC 108 (H) 11/07/2022   Lab Results  Component Value Date   TRIG 113 11/07/2022   Lab Results  Component Value Date   CHOLHDL 3.7 11/07/2022   Lab Results  Component Value Date   HGBA1C 5.9 (H) 11/07/2022      Assessment & Plan:  Depression, major, single episode, mild (HCC) Assessment &  Plan: The patient has been following up with integrated behavioral health and reports significant improvement, noting that for 5 out of the 7  days of the week, he feels much better. He is looking forward to doing things he has always wanted to do this year, with hopes of meeting new people and establishing a romantic relationship. The patient mentions that his prayer group, along with therapy, has helped him tremendously. No suicidal thoughts or ideation were noted. He reports doing well on Zoloft  50 mg daily and denies any worsening of his symptoms. The plan is to continue the patient on his current treatment regimen and encourage ongoing follow-up for cognitive behavioral therapy. The patient is advised to inform the provider if his symptoms worsen. Additionally, adherence to nonpharmacological interventions, including meditation, mindfulness, deep breathing exercises, ensuring adequate rest, and maintaining a well-balanced diet, is encouraged. The patient verbalized understanding and is aware of the plan of care.    Tremors of nervous system Assessment & Plan: The patient complains of a rest tremor that began 2 months ago. He denies shuffling gait, slow movement, stiffness in the arm, leg, or trunk, balance issues, and reduced facial expression. No family history of Parkinson's disease has been noted. The patient reports that his rest tremor is predominantly present in his left hand. A referral will be placed to neurology for further workup and collaborative care. The patient verbalized understanding and is aware of the plan of care.   Orders: -     Ambulatory referral to Neurology  Note: This chart has been completed using Dragon Medical Dictation software, and while attempts have been made to ensure accuracy, certain words and phrases may not be transcribed as intended.    Follow-up: Return in about 3 months (around 05/08/2023).   Khalani Novoa, FNP

## 2023-02-07 NOTE — Assessment & Plan Note (Signed)
 The patient complains of a rest tremor that began 2 months ago. He denies shuffling gait, slow movement, stiffness in the arm, leg, or trunk, balance issues, and reduced facial expression. No family history of Parkinson's disease has been noted. The patient reports that his rest tremor is predominantly present in his left hand. A referral will be placed to neurology for further workup and collaborative care. The patient verbalized understanding and is aware of the plan of care.

## 2023-02-07 NOTE — Assessment & Plan Note (Signed)
 The patient has been following up with integrated behavioral health and reports significant improvement, noting that for 5 out of the 7 days of the week, he feels much better. He is looking forward to doing things he has always wanted to do this year, with hopes of meeting new people and establishing a romantic relationship. The patient mentions that his prayer group, along with therapy, has helped him tremendously. No suicidal thoughts or ideation were noted. He reports doing well on Zoloft  50 mg daily and denies any worsening of his symptoms. The plan is to continue the patient on his current treatment regimen and encourage ongoing follow-up for cognitive behavioral therapy. The patient is advised to inform the provider if his symptoms worsen. Additionally, adherence to nonpharmacological interventions, including meditation, mindfulness, deep breathing exercises, ensuring adequate rest, and maintaining a well-balanced diet, is encouraged. The patient verbalized understanding and is aware of the plan of care.

## 2023-02-21 ENCOUNTER — Ambulatory Visit: Payer: Medicare Other | Admitting: Professional Counselor

## 2023-02-21 DIAGNOSIS — F320 Major depressive disorder, single episode, mild: Secondary | ICD-10-CM

## 2023-02-21 NOTE — BH Specialist Note (Signed)
[place] [facility] BH Telephone Follow-up  MRN: [id number] NAME: Russell Mckinney Date: 02/21/23  Start time: Start Time: 1000 End time: Stop Time: 1034 Total time: Total Time in Minutes (Visit): 34 Call number: Visit Number: 3- Third Visit  Reason for call today:  The patient is a 73 year old male who presented for a collaborative care follow-up. He reports significant improvement in mood since our last visit, attributing this to being more proactive, staying busy, and engaging socially to avoid isolation as he continues to process the loss of his wife.   The past three years have been marked by prolonged grief, including uncontrollable crying, isolation, and feelings of depression. However, he recently described a renewed sense of hope and has begun exploring dating again, feeling emotionally ready despite some lingering guilt. He has been navigating dating sites, which he finds challenging but worthwhile, and has been attending the senior center regularly to make friends and maintain social connections.   Overall, he reports improved mood, minimal anxiety, good adherence to his prescribed medications, adequate sleep, and a good appetite. His progress is reflected in improved PHQ-9 and GAD-7 scores. The plan is to continue biweekly follow-ups to support relapse prevention, maintain his positive momentum, and address any ongoing challenges, particularly related to his feelings of guilt and adjustment to dating. He will also be encouraged to sustain his social engagement and meaningful activities as part of his ongoing recovery. The next follow-up is scheduled for two weeks.    PHQ-9 Scores:     02/21/2023   10:11 AM 02/07/2023   10:38 AM 02/07/2023   10:10 AM 01/10/2023   11:10 AM 12/20/2022   10:29 AM  Depression screen PHQ 2/9  Decreased Interest 0 1 0 1 0  Down, Depressed, Hopeless 1 0  1 0  PHQ - 2 Score 1 1 0 2 0  Altered sleeping 1 0 0 1 0  Tired, decreased energy 1 1 0 1 0  Change in  appetite 0 0 0 1 0  Feeling bad or failure about yourself  1 0 0 1 0  Trouble concentrating 0 0 0 1 0  Moving slowly or fidgety/restless 0 0 0 0 0  Suicidal thoughts 0 0 0 0 0  PHQ-9 Score 4 2 0 7 0  Difficult doing work/chores Somewhat difficult Not difficult at all Not difficult at all Somewhat difficult Not difficult at all   GAD-7 Scores:     02/21/2023   10:11 AM 02/07/2023   10:39 AM 01/10/2023   11:17 AM 12/20/2022   10:29 AM  GAD 7 : Generalized Anxiety Score  Nervous, Anxious, on Edge 0 0 0 0  Control/stop worrying 0 0 1 0  Worry too much - different things 0 0 1 0  Trouble relaxing 0 0 0 0  Restless 0 0 0 0  Easily annoyed or irritable 0 0 0 0  Afraid - awful might happen 0 0 0 0  Total GAD 7 Score 0 0 2 0  Anxiety Difficulty Not difficult at all Not difficult at all Somewhat difficult Not difficult at all    Stress Current stressors:  "Not much" Sleep:  good Appetite:  good Coping ability:  improved Patient taking medications as prescribed:  yes  Current medications:  Outpatient Encounter Medications as of 02/21/2023  Medication Sig   EPINEPHrine 0.3 mg/0.3 mL IJ SOAJ injection Inject 0.3 mg into the skin as directed.   rosuvastatin (CRESTOR) 10 MG tablet Take 1 tablet (10 mg total)  by mouth daily.   sertraline (ZOLOFT) 50 MG tablet Take 1 tablet (50 mg total) by mouth daily.   tadalafil (CIALIS) 20 MG tablet Take 1 tablet (20 mg total) by mouth as needed for erectile dysfunction. Do not take more than one dose per day.   No facility-administered encounter medications on file as of 02/21/2023.     Self-harm Behaviors Risk Assessment Self-harm risk factors:  Depression, grief, loss of spouse, isolation Patient endorses recent thoughts of harming self:  Denies   Danger to Others Risk Assessment Danger to others risk factors:  None Patient endorses recent thoughts of harming others:  Denies    Substance Use Assessment Patient recently consumed alcohol:   No  Alcohol Use Disorder Identification Test (AUDIT):     09/29/2020    3:05 PM 11/26/2021    4:02 PM 10/30/2022   10:42 AM  Alcohol Use Disorder Test (AUDIT)  1. How often do you have a drink containing alcohol? 0 1 0  2. How many drinks containing alcohol do you have on a typical day when you are drinking? 0 0 0  3. How often do you have six or more drinks on one occasion? 0 1 0  AUDIT-C Score 0 2 0    Goals, Interventions and Follow-up Plan Goals:  Maintain improved mood relapse prevention Interventions: Mindfulness or Relaxation Training and Behavioral Activation Follow-up Plan:    Reuel Boom

## 2023-03-07 ENCOUNTER — Ambulatory Visit: Payer: Medicare Other | Admitting: Professional Counselor

## 2023-03-21 ENCOUNTER — Ambulatory Visit: Payer: Medicare Other | Admitting: Professional Counselor

## 2023-03-21 DIAGNOSIS — F320 Major depressive disorder, single episode, mild: Secondary | ICD-10-CM

## 2023-03-21 NOTE — BH Specialist Note (Signed)
[place] [facility] BH Telephone Follow-up  MRN: [id number] NAME: Russell Mckinney Date: 03/21/23  Start time: Start Time: 1000 End time: Stop Time: 1030 Total time: Total Time in Minutes (Visit): 30 Call number: Visit Number: 4- Fourth Visit  Purpose of visit: The patient is a 73 year old male who presented for a collaborative care follow-up session. He appeared to be in a relatively positive mood but noted that some of his grief was recently triggered when he found a CD of songs from his wedding. He continues to grieve the death of his wife and is still adjusting to life without her.   However, he reported that the three collaborative care sessions he participated in were beneficial, along with taking his prescribed medication, which also helped. The most significant improvement, according to the patient, came from attending the local senior center three times a week, where he has become more social and found a supportive outlet. His PHQ-9 scores have decreased by approximately half, and his GAD-7 score remains at zero, indicating that he is functioning well and managing his depression effectively.   The patient reported discontinuing his Zoloft two weeks ago due to hand tremors, which he believed were caused by the medication. He stopped taking it without medical advice but has not experienced any negative side effects since discontinuation and stated that he has been feeling well. He expressed that he is not interested in continuing medication or trying another one, preferring to manage his depression independently.   Behavioral health counselor recommended that he follow up with his primary care doctor regarding the medication discontinuation. Given his progress and preference for non-medication management, it was determined that transitioning him to traditional therapy with a specialized grief therapist would be most appropriate. The patient agreed to this plan, and collaborative care services will be  discontinued at this time.    PHQ-9 Scores:     03/21/2023   10:02 AM 02/21/2023   10:11 AM 02/07/2023   10:38 AM 02/07/2023   10:10 AM 01/10/2023   11:10 AM  Depression screen PHQ 2/9  Decreased Interest 1 0 1 0 1  Down, Depressed, Hopeless 1 1 0  1  PHQ - 2 Score 2 1 1  0 2  Altered sleeping 0 1 0 0 1  Tired, decreased energy 1 1 1  0 1  Change in appetite 1 0 0 0 1  Feeling bad or failure about yourself  1 1 0 0 1  Trouble concentrating 0 0 0 0 1  Moving slowly or fidgety/restless 0 0 0 0 0  Suicidal thoughts 0 0 0 0 0  PHQ-9 Score 5 4 2  0 7  Difficult doing work/chores Somewhat difficult Somewhat difficult Not difficult at all Not difficult at all Somewhat difficult   GAD-7 Scores:     03/21/2023   10:03 AM 02/21/2023   10:11 AM 02/07/2023   10:39 AM 01/10/2023   11:17 AM  GAD 7 : Generalized Anxiety Score  Nervous, Anxious, on Edge 0 0 0 0  Control/stop worrying 0 0 0 1  Worry too much - different things 0 0 0 1  Trouble relaxing 0 0 0 0  Restless 0 0 0 0  Easily annoyed or irritable 0 0 0 0  Afraid - awful might happen 0 0 0 0  Total GAD 7 Score 0 0 0 2  Anxiety Difficulty Not difficult at all Not difficult at all Not difficult at all Somewhat difficult    Stress Current stressors:  Grief  Sleep:  good Appetite:  good Coping ability:  good Patient taking medications as prescribed:  No, discontinued Zoloft  Current medications:  Outpatient Encounter Medications as of 03/21/2023  Medication Sig   EPINEPHrine 0.3 mg/0.3 mL IJ SOAJ injection Inject 0.3 mg into the skin as directed.   rosuvastatin (CRESTOR) 10 MG tablet Take 1 tablet (10 mg total) by mouth daily.   sertraline (ZOLOFT) 50 MG tablet Take 1 tablet (50 mg total) by mouth daily.   tadalafil (CIALIS) 20 MG tablet Take 1 tablet (20 mg total) by mouth as needed for erectile dysfunction. Do not take more than one dose per day.   No facility-administered encounter medications on file as of 03/21/2023.      Self-harm Behaviors Risk Assessment Self-harm risk factors:  Grief, aging,depression Patient endorses recent thoughts of harming self:  Denies   Danger to Others Risk Assessment Danger to others risk factors:  None  Patient endorses recent thoughts of harming others:  Denies    Goals, Interventions and Follow-up Plan Goals: Begin healthy grieving over loss Interventions: Motivational Interviewing Follow-up Plan: Refer for Grief Counseling    Reuel Boom

## 2023-03-21 NOTE — Patient Instructions (Signed)
Russell Mckinney, Russell Mckinney,?NCC, [facility]   Address: [address] [place], [place] [address]   Phone: [phone]   Services: Individual/couples therapy, mental wellness and behavioral coaching    Website: [employer] in [place], [place] -  [email or web address]

## 2023-05-05 ENCOUNTER — Encounter: Payer: Self-pay | Admitting: Family Medicine

## 2023-05-05 ENCOUNTER — Ambulatory Visit (INDEPENDENT_AMBULATORY_CARE_PROVIDER_SITE_OTHER): Payer: Self-pay | Admitting: Family Medicine

## 2023-05-05 VITALS — BP 113/70 | HR 75 | Ht 70.0 in | Wt 190.0 lb

## 2023-05-05 DIAGNOSIS — M79672 Pain in left foot: Secondary | ICD-10-CM | POA: Diagnosis not present

## 2023-05-05 NOTE — Assessment & Plan Note (Signed)
 I recommend the following conservative management for foot pain: Rest and activity modification Stretching exercises for the foot and calf Ice application to reduce inflammation Supportive footwear or orthotics NSAIDs for pain relief Night splints or heel cushions The patient is encouraged to follow up if the pain becomes persistent, or if there is a decrease in range of motion or function.

## 2023-05-05 NOTE — Progress Notes (Signed)
 Acute Office Visit  Subjective:    Patient ID: Russell Mckinney, male    DOB: [birth year], 73 y.o.   MRN: [id number]  Chief Complaint  Patient presents with   Acute Visit    Pain in left foot  from pinky toe reaching down foot to the arch. Pain has been spanning over a month. Pt. Denies any pertinent injury    HPI The patient is seen today for intermittent foot pain lasting a few seconds, with no numbness, tingling, swelling, or recent injury reported.   Past Medical History:  Diagnosis Date   Diverticulitis    2017   High cholesterol    High cholesterol     Past Surgical History:  Procedure Laterality Date   TONSILLECTOMY      Family History  Problem Relation Age of Onset   Alzheimer's disease Mother    Diabetes Brother     Social History   Socioeconomic History   Marital status: Widowed    Spouse name: Rawlins Stuard   Number of children: 0   Years of education: 14   Highest education level: Associate degree: occupational, Scientist, product/process development, or vocational program  Occupational History   Not on file  Tobacco Use   Smoking status: Never    Passive exposure: Never   Smokeless tobacco: Never  Vaping Use   Vaping status: Never Used  Substance and Sexual Activity   Alcohol use: No   Drug use: No   Sexual activity: Not Currently    Partners: Female    Birth control/protection: None  Other Topics Concern   Not on file  Social History Narrative   Lives home alone. Goes to the church three times a week.       Social Drivers of Corporate investment banker Strain: Low Risk  (10/30/2022)   Overall Financial Resource Strain (CARDIA)    Difficulty of Paying Living Expenses: Not hard at all  Food Insecurity: No Food Insecurity (10/30/2022)   Hunger Vital Sign    Worried About Running Out of Food in the Last Year: Never true    Ran Out of Food in the Last Year: Never true  Transportation Needs: No Transportation Needs (10/30/2022)   PRAPARE - Scientist, research (physical sciences) (Medical): No    Lack of Transportation (Non-Medical): No  Physical Activity: Sufficiently Active (10/30/2022)   Exercise Vital Sign    Days of Exercise per Week: 7 days    Minutes of Exercise per Session: 30 min  Stress: No Stress Concern Present (10/30/2022)   [employer] of Occupational Health - Occupational Stress Questionnaire    Feeling of Stress : Only a little  Social Connections: Moderately Isolated (10/30/2022)   Social Connection and Isolation Panel [NHANES]    Frequency of Communication with Friends and Family: More than three times a week    Frequency of Social Gatherings with Friends and Family: More than three times a week    Attends Religious Services: More than 4 times per year    Active Member of [employer] or Organizations: No    Attends Banker Meetings: Never    Marital Status: Widowed  Intimate Partner Violence: Not At Risk (10/30/2022)   Humiliation, Afraid, Rape, and Kick questionnaire    Fear of Current or Ex-Partner: No    Emotionally Abused: No    Physically Abused: No    Sexually Abused: No    Outpatient Medications Prior to Visit  Medication Sig Dispense Refill  rosuvastatin (CRESTOR) 10 MG tablet Take 1 tablet (10 mg total) by mouth daily. 90 tablet 3   EPINEPHrine 0.3 mg/0.3 mL IJ SOAJ injection Inject 0.3 mg into the skin as directed. 2 each 1   sertraline (ZOLOFT) 50 MG tablet Take 1 tablet (50 mg total) by mouth daily. (Patient not taking: Reported on 05/05/2023) 30 tablet 3   tadalafil (CIALIS) 20 MG tablet Take 1 tablet (20 mg total) by mouth as needed for erectile dysfunction. Do not take more than one dose per day. (Patient not taking: Reported on 05/05/2023) 20 tablet 1   No facility-administered medications prior to visit.    Allergies  Allergen Reactions   Codeine Other (See Comments) and Anaphylaxis    Other reaction(s): Other (See Comments)  GI Upset   GI Upset    Review of Systems  Constitutional:   Negative for fatigue and fever.  Eyes:  Negative for visual disturbance.  Respiratory:  Negative for chest tightness and shortness of breath.   Cardiovascular:  Negative for chest pain and palpitations.  Neurological:  Negative for dizziness and headaches.       Objective:    Physical Exam HENT:     Head: Normocephalic.     Right Ear: External ear normal.     Left Ear: External ear normal.     Nose: No congestion or rhinorrhea.     Mouth/Throat:     Mouth: Mucous membranes are moist.  Cardiovascular:     Rate and Rhythm: Regular rhythm.     Heart sounds: No murmur heard. Pulmonary:     Effort: No respiratory distress.     Breath sounds: Normal breath sounds.  Musculoskeletal:     Right foot: Normal range of motion and normal capillary refill. No swelling, deformity, bunion, foot drop or tenderness.     Left foot: Normal range of motion and normal capillary refill. No swelling, deformity, bunion, foot drop or tenderness.  Neurological:     Mental Status: He is alert.     BP 113/70   Pulse 75   Ht 5\' 10"  (1.778 m)   Wt 190 lb (86.2 kg)   SpO2 (!) 75%   BMI 27.26 kg/m  Wt Readings from Last 3 Encounters:  05/05/23 190 lb (86.2 kg)  02/07/23 188 lb 1.3 oz (85.3 kg)  12/20/22 182 lb 1.9 oz (82.6 kg)       Assessment & Plan:  Foot pain, left Assessment & Plan: I recommend the following conservative management for foot pain: Rest and activity modification Stretching exercises for the foot and calf Ice application to reduce inflammation Supportive footwear or orthotics NSAIDs for pain relief Night splints or heel cushions The patient is encouraged to follow up if the pain becomes persistent, or if there is a decrease in range of motion or function.    Note: This chart has been completed using Engineer, civil (consulting) software, and while attempts have been made to ensure accuracy, certain words and phrases may not be transcribed as intended.    Gilmore Laroche,  FNP

## 2023-05-05 NOTE — Patient Instructions (Addendum)
 I appreciate the opportunity to provide care to you today!    Follow up:  4 months  Plantar Fasciitis Management: Rest and activity modification Stretching exercises for the foot and calf Ice application to reduce inflammation Supportive footwear or orthotics NSAIDs for pain relief Night splints or heel cushions  Attached with your AVS, you will find valuable resources for self-education. I highly recommend dedicating some time to thoroughly examine them.   Please continue to a heart-healthy diet and increase your physical activities. Try to exercise for at least five days a week.    It was a pleasure to see you and I look forward to continuing to work together on your health and well-being. Please do not hesitate to call the office if you need care or have questions about your care.  In case of emergency, please visit the Emergency Department for urgent care, or contact our clinic at [phone] to schedule an appointment. We're here to help you!   Have a wonderful day and week. With Gratitude, Gilmore Laroche MSN, FNP-BC

## 2023-05-13 ENCOUNTER — Ambulatory Visit: Payer: Medicare Other | Admitting: Family Medicine

## 2023-05-20 ENCOUNTER — Ambulatory Visit: Payer: Medicare Other | Admitting: Neurology

## 2023-09-04 ENCOUNTER — Ambulatory Visit (INDEPENDENT_AMBULATORY_CARE_PROVIDER_SITE_OTHER): Admitting: Family Medicine

## 2023-09-04 ENCOUNTER — Encounter: Payer: Self-pay | Admitting: Family Medicine

## 2023-09-04 VITALS — BP 122/80 | HR 64 | Ht 70.0 in | Wt 183.0 lb

## 2023-09-04 DIAGNOSIS — E7849 Other hyperlipidemia: Secondary | ICD-10-CM | POA: Diagnosis not present

## 2023-09-04 DIAGNOSIS — R7301 Impaired fasting glucose: Secondary | ICD-10-CM

## 2023-09-04 DIAGNOSIS — E038 Other specified hypothyroidism: Secondary | ICD-10-CM | POA: Diagnosis not present

## 2023-09-04 DIAGNOSIS — E559 Vitamin D deficiency, unspecified: Secondary | ICD-10-CM | POA: Diagnosis not present

## 2023-09-04 DIAGNOSIS — R251 Tremor, unspecified: Secondary | ICD-10-CM | POA: Diagnosis not present

## 2023-09-04 MED ORDER — PRIMIDONE 50 MG PO TABS: 25.0000 mg | ORAL_TABLET | Freq: Every day | ORAL | 1 refills | Status: DC

## 2023-09-04 NOTE — Patient Instructions (Addendum)
 I appreciate the opportunity to provide care to you today!    Follow up:  1 months  Labs: please stop by the lab today to get your blood drawn (CBC, CMP, TSH, Lipid profile, HgA1c, Vit D)  Tremors Start primidone  25 mg daily (half of a 50 mg tablet) for your tremors.   [facility] Address: [address], [place], [place] [address] Phone: [phone]  Referrals today-  Neurology  Attached with your AVS, you will find valuable resources for self-education. I highly recommend dedicating some time to thoroughly examine them.   Please continue to a heart-healthy diet and increase your physical activities. Try to exercise for at least five days a week.    It was a pleasure to see you and I look forward to continuing to work together on your health and well-being. Please do not hesitate to call the office if you need care or have questions about your care.  In case of emergency, please visit the Emergency Department for urgent care, or contact our clinic at [phone] to schedule an appointment. We're here to help you!   Have a wonderful day and week. With Gratitude, Eldoris Beiser MSN, FNP-BC

## 2023-09-04 NOTE — Progress Notes (Signed)
 Established Patient Office Visit  Subjective:  Patient ID: Russell Mckinney, male    DOB: Sep 05, 1950  Age: 73 y.o. MRN: [id number]  CC:  Chief Complaint  Patient presents with   Care Management    Four month follow up    Tremors    Tremors in fingers    HPI Russell Mckinney is a 73 y.o. male with past medical history of Tremors of nervous system, depression presents for f/u of  chronic medical conditions.  For the details of today's visit, please refer to the assessment and plan.       Past Medical History:  Diagnosis Date   Diverticulitis    2017   High cholesterol    High cholesterol     Past Surgical History:  Procedure Laterality Date   TONSILLECTOMY      Family History  Problem Relation Age of Onset   Alzheimer's disease Mother    Diabetes Brother     Social History   Socioeconomic History   Marital status: Widowed    Spouse name: Kolbey Teichert   Number of children: 0   Years of education: 14   Highest education level: Associate degree: occupational, Scientist, product/process development, or vocational program  Occupational History   Not on file  Tobacco Use   Smoking status: Never    Passive exposure: Never   Smokeless tobacco: Never  Vaping Use   Vaping status: Never Used  Substance and Sexual Activity   Alcohol use: No   Drug use: No   Sexual activity: Not Currently    Partners: Female    Birth control/protection: None  Other Topics Concern   Not on file  Social History Narrative   Lives home alone. Goes to the church three times a week.       Social Drivers of Corporate investment banker Strain: Low Risk  (10/30/2022)   Overall Financial Resource Strain (CARDIA)    Difficulty of Paying Living Expenses: Not hard at all  Food Insecurity: No Food Insecurity (10/30/2022)   Hunger Vital Sign    Worried About Running Out of Food in the Last Year: Never true    Ran Out of Food in the Last Year: Never true  Transportation Needs: No Transportation Needs (10/30/2022)   PRAPARE -  Administrator, Civil Service (Medical): No    Lack of Transportation (Non-Medical): No  Physical Activity: Sufficiently Active (10/30/2022)   Exercise Vital Sign    Days of Exercise per Week: 7 days    Minutes of Exercise per Session: 30 min  Stress: No Stress Concern Present (10/30/2022)   [employer] of Occupational Health - Occupational Stress Questionnaire    Feeling of Stress : Only a little  Social Connections: Moderately Isolated (10/30/2022)   Social Connection and Isolation Panel    Frequency of Communication with Friends and Family: More than three times a week    Frequency of Social Gatherings with Friends and Family: More than three times a week    Attends Religious Services: More than 4 times per year    Active Member of [employer] or Organizations: No    Attends Banker Meetings: Never    Marital Status: Widowed  Intimate Partner Violence: Not At Risk (10/30/2022)   Humiliation, Afraid, Rape, and Kick questionnaire    Fear of Current or Ex-Partner: No    Emotionally Abused: No    Physically Abused: No    Sexually Abused: No    Outpatient Medications Prior  to Visit  Medication Sig Dispense Refill   EPINEPHrine  0.3 mg/0.3 mL IJ SOAJ injection Inject 0.3 mg into the skin as directed. 2 each 1   rosuvastatin  (CRESTOR ) 10 MG tablet Take 1 tablet (10 mg total) by mouth daily. 90 tablet 3   sertraline  (ZOLOFT ) 50 MG tablet Take 1 tablet (50 mg total) by mouth daily. (Patient not taking: Reported on 05/05/2023) 30 tablet 3   tadalafil  (CIALIS ) 20 MG tablet Take 1 tablet (20 mg total) by mouth as needed for erectile dysfunction. Do not take more than one dose per day. (Patient not taking: Reported on 05/05/2023) 20 tablet 1   No facility-administered medications prior to visit.    Allergies  Allergen Reactions   Codeine Other (See Comments) and Anaphylaxis    Other reaction(s): Other (See Comments)  GI Upset   GI Upset    ROS Review of Systems   Constitutional:  Negative for fatigue and fever.  Eyes:  Negative for visual disturbance.  Respiratory:  Negative for chest tightness and shortness of breath.   Cardiovascular:  Negative for chest pain and palpitations.  Neurological:  Positive for tremors. Negative for dizziness and headaches.      Objective:    Physical Exam HENT:     Head: Normocephalic.     Right Ear: External ear normal.     Left Ear: External ear normal.     Nose: No congestion or rhinorrhea.     Mouth/Throat:     Mouth: Mucous membranes are moist.  Cardiovascular:     Rate and Rhythm: Regular rhythm.     Heart sounds: No murmur heard. Pulmonary:     Effort: No respiratory distress.     Breath sounds: Normal breath sounds.  Neurological:     Mental Status: He is alert.     BP 122/80   Pulse 64   Ht 5' 10 (1.778 m)   Wt 183 lb (83 kg)   SpO2 95%   BMI 26.26 kg/m  Wt Readings from Last 3 Encounters:  09/04/23 183 lb (83 kg)  05/05/23 190 lb (86.2 kg)  02/07/23 188 lb 1.3 oz (85.3 kg)    Lab Results  Component Value Date   TSH 1.710 11/07/2022   Lab Results  Component Value Date   WBC 7.4 11/07/2022   HGB 13.6 11/07/2022   HCT 41.9 11/07/2022   MCV 92 11/07/2022   PLT 176 11/07/2022   Lab Results  Component Value Date   NA 141 11/07/2022   K 4.3 11/07/2022   CO2 24 11/07/2022   GLUCOSE 99 11/07/2022   BUN 12 11/07/2022   CREATININE 1.40 (H) 11/07/2022   BILITOT 0.4 11/07/2022   ALKPHOS 70 11/07/2022   AST 14 11/07/2022   ALT 23 11/07/2022   PROT 6.5 11/07/2022   ALBUMIN 4.2 11/07/2022   CALCIUM  9.4 11/07/2022   ANIONGAP 8 10/02/2020   EGFR 54 (L) 11/07/2022   Lab Results  Component Value Date   CHOL 175 11/07/2022   Lab Results  Component Value Date   HDL 47 11/07/2022   Lab Results  Component Value Date   LDLCALC 108 (H) 11/07/2022   Lab Results  Component Value Date   TRIG 113 11/07/2022   Lab Results  Component Value Date   CHOLHDL 3.7 11/07/2022    Lab Results  Component Value Date   HGBA1C 5.9 (H) 11/07/2022      Assessment & Plan:  Tremors of nervous system Assessment & Plan: The  patient reports experiencing significant forgetfulness over the past 3-4 months. He has difficulty remembering people's names and often forgets where he is going, especially while driving, requiring frequent reminders and self-correction. In addition, he has noticed a progression of hand tremors, which initially started in the left hand and have now affected the right hand as well. The tremors occur intermittently throughout the day and are triggered both at rest and with movement. He reports that the tremors become more noticeable when he begins using his hands. Cognitive screening with MMSE yielded a score of 24, suggesting mild cognitive impairment.  Will initiate pharmacologic treatment with primidone  25 mg daily (half of a 50 mg tablet) for management of tremors. A referral has been placed to [facility] for further evaluation and management.   Orders: -     Primidone ; Take 0.5 tablets (25 mg total) by mouth at bedtime.  Dispense: 30 tablet; Refill: 1 -     Ambulatory referral to Neurology  IFG (impaired fasting glucose) -     Hemoglobin A1c  Vitamin D  deficiency -     VITAMIN D  25 Hydroxy (Vit-D Deficiency, Fractures)  TSH (thyroid -stimulating hormone deficiency) -     TSH + free T4  Other hyperlipidemia -     Lipid panel -     CMP14+EGFR -     CBC with Differential/Platelet  Note: This chart has been completed using Engineer, civil (consulting) software, and while attempts have been made to ensure accuracy, certain words and phrases may not be transcribed as intended.    Follow-up: Return in about 1 month (around 10/05/2023).   Aundra Pung, FNP

## 2023-09-08 ENCOUNTER — Ambulatory Visit: Admitting: Neurology

## 2023-09-09 NOTE — Assessment & Plan Note (Signed)
 The patient reports experiencing significant forgetfulness over the past 3-4 months. He has difficulty remembering people's names and often forgets where he is going, especially while driving, requiring frequent reminders and self-correction. In addition, he has noticed a progression of hand tremors, which initially started in the left hand and have now affected the right hand as well. The tremors occur intermittently throughout the day and are triggered both at rest and with movement. He reports that the tremors become more noticeable when he begins using his hands. Cognitive screening with MMSE yielded a score of 24, suggesting mild cognitive impairment.  Will initiate pharmacologic treatment with primidone  25 mg daily (half of a 50 mg tablet) for management of tremors. A referral has been placed to [facility] for further evaluation and management.

## 2023-09-18 ENCOUNTER — Ambulatory Visit (INDEPENDENT_AMBULATORY_CARE_PROVIDER_SITE_OTHER): Admitting: Nurse Practitioner

## 2023-09-18 ENCOUNTER — Encounter: Payer: Self-pay | Admitting: Nurse Practitioner

## 2023-09-18 VITALS — BP 148/90 | HR 57 | Ht 71.0 in | Wt 185.0 lb

## 2023-09-18 DIAGNOSIS — R300 Dysuria: Secondary | ICD-10-CM | POA: Insufficient documentation

## 2023-09-18 DIAGNOSIS — E781 Pure hyperglyceridemia: Secondary | ICD-10-CM | POA: Diagnosis not present

## 2023-09-18 DIAGNOSIS — R7301 Impaired fasting glucose: Secondary | ICD-10-CM | POA: Diagnosis not present

## 2023-09-18 DIAGNOSIS — E559 Vitamin D deficiency, unspecified: Secondary | ICD-10-CM | POA: Diagnosis not present

## 2023-09-18 DIAGNOSIS — K5904 Chronic idiopathic constipation: Secondary | ICD-10-CM

## 2023-09-18 DIAGNOSIS — E7849 Other hyperlipidemia: Secondary | ICD-10-CM | POA: Diagnosis not present

## 2023-09-18 DIAGNOSIS — E038 Other specified hypothyroidism: Secondary | ICD-10-CM | POA: Diagnosis not present

## 2023-09-18 MED ORDER — CIPROFLOXACIN HCL 500 MG PO TABS: 500.0000 mg | ORAL_TABLET | Freq: Two times a day (BID) | ORAL | 0 refills | Status: AC

## 2023-09-18 NOTE — Progress Notes (Addendum)
 Established Patient Office Visit  Subjective:  Patient ID: Russell Mckinney, male    DOB: Apr 10, 1950  Age: 73 y.o. MRN: [id number]  Chief Complaint  Patient presents with   Urinary Tract Infection    Blood in urine has cleared up    Patient here today for a UTI.  He reports he held his urine while he was travelling from [place] .  Patient feels relief after urination.  Patient feels little pins and needles when urinating.  Patient reports symptoms x 1 day and a half.  Patient just recently lost his brother from [place] .  He denies any thoughts of harming self or others.  He does go to [employer].    Urinary Tract Infection     No other concerns at this time.   Past Medical History:  Diagnosis Date   Diverticulitis    2017   High cholesterol    High cholesterol     Past Surgical History:  Procedure Laterality Date   TONSILLECTOMY      Social History   Socioeconomic History   Marital status: Widowed    Spouse name: Milano Rosevear   Number of children: 0   Years of education: 14   Highest education level: Associate degree: occupational, Scientist, product/process development, or vocational program  Occupational History   Not on file  Tobacco Use   Smoking status: Never    Passive exposure: Never   Smokeless tobacco: Never  Vaping Use   Vaping status: Never Used  Substance and Sexual Activity   Alcohol use: No   Drug use: No   Sexual activity: Not Currently    Partners: Female    Birth control/protection: None  Other Topics Concern   Not on file  Social History Narrative   Lives home alone. Goes to the church three times a week.       Social Drivers of Corporate investment banker Strain: Low Risk  (10/30/2022)   Overall Financial Resource Strain (CARDIA)    Difficulty of Paying Living Expenses: Not hard at all  Food Insecurity: No Food Insecurity (10/30/2022)   Hunger Vital Sign    Worried About Running Out of Food in the Last Year: Never true    Ran Out of Food in the Last Year: Never  true  Transportation Needs: No Transportation Needs (10/30/2022)   PRAPARE - Administrator, Civil Service (Medical): No    Lack of Transportation (Non-Medical): No  Physical Activity: Sufficiently Active (10/30/2022)   Exercise Vital Sign    Days of Exercise per Week: 7 days    Minutes of Exercise per Session: 30 min  Stress: No Stress Concern Present (10/30/2022)   [employer] of Occupational Health - Occupational Stress Questionnaire    Feeling of Stress : Only a little  Social Connections: Moderately Isolated (10/30/2022)   Social Connection and Isolation Panel    Frequency of Communication with Friends and Family: More than three times a week    Frequency of Social Gatherings with Friends and Family: More than three times a week    Attends Religious Services: More than 4 times per year    Active Member of [employer] or Organizations: No    Attends Banker Meetings: Never    Marital Status: Widowed  Intimate Partner Violence: Not At Risk (10/30/2022)   Humiliation, Afraid, Rape, and Kick questionnaire    Fear of Current or Ex-Partner: No    Emotionally Abused: No    Physically Abused: No  Sexually Abused: No    Family History  Problem Relation Age of Onset   Alzheimer's disease Mother    Diabetes Brother     Allergies  Allergen Reactions   Codeine Other (See Comments) and Anaphylaxis    Other reaction(s): Other (See Comments)  GI Upset   GI Upset    Outpatient Medications Prior to Visit  Medication Sig   EPINEPHrine  0.3 mg/0.3 mL IJ SOAJ injection Inject 0.3 mg into the skin as directed.   primidone  (MYSOLINE ) 50 MG tablet Take 0.5 tablets (25 mg total) by mouth at bedtime.   rosuvastatin  (CRESTOR ) 10 MG tablet Take 1 tablet (10 mg total) by mouth daily.   sertraline  (ZOLOFT ) 50 MG tablet Take 1 tablet (50 mg total) by mouth daily.   tadalafil  (CIALIS ) 20 MG tablet Take 1 tablet (20 mg total) by mouth as needed for erectile dysfunction. Do  not take more than one dose per day. (Patient not taking: Reported on 09/18/2023)   No facility-administered medications prior to visit.    ROS     Objective:   BP (!) 148/90   Pulse (!) 57   Ht 5' 11 (1.803 m)   Wt 185 lb (83.9 kg)   BMI 25.80 kg/m   Vitals:   09/18/23 1311  BP: (!) 148/90  Pulse: (!) 57  Height: 5' 11 (1.803 m)  Weight: 185 lb (83.9 kg)  BMI (Calculated): 25.81    Physical Exam Vitals and nursing note reviewed.  Constitutional:      Appearance: Normal appearance.  HENT:     Head: Normocephalic.     Nose: Nose normal.     Mouth/Throat:     Mouth: Mucous membranes are moist.  Cardiovascular:     Rate and Rhythm: Normal rate and regular rhythm.     Pulses: Normal pulses.     Heart sounds: Normal heart sounds.  Pulmonary:     Effort: Pulmonary effort is normal.     Breath sounds: Normal breath sounds.  Abdominal:     General: Bowel sounds are normal.     Palpations: Abdomen is soft.  Musculoskeletal:        General: Normal range of motion.     Cervical back: Normal range of motion and neck supple.  Skin:    General: Skin is warm and dry.  Neurological:     Mental Status: He is alert and oriented to person, place, and time.  Psychiatric:        Mood and Affect: Mood normal.        Behavior: Behavior normal.      No results found for any visits on 09/18/23.  No results found for this or any previous visit (from the past 2160 hours).    Assessment & Plan: 1) Dysuria - UA and culture C & S today    Problem List Items Addressed This Visit       Other   Dysuria - Primary   Relevant Orders   UA/M w/rflx Culture, Routine    No follow-ups on file.   Total time spent: 20 minutes  Neale Carpen, NP  09/18/2023   This document may have been prepared by [facility] Voice Recognition software and as such may include unintentional dictation errors.

## 2023-09-18 NOTE — Patient Instructions (Addendum)
 1) UA for C & S today 2) Cipro  500 mg BID x 3 days 3)

## 2023-09-18 NOTE — Addendum Note (Signed)
 Addended by: GLENNON SAND on: 09/18/2023 02:10 PM   Modules accepted: Orders

## 2023-09-19 LAB — MICROSCOPIC EXAMINATION
Bacteria, UA: NONE SEEN
Casts: NONE SEEN /LPF
RBC, Urine: NONE SEEN /HPF (ref 0–2)

## 2023-09-19 LAB — UA/M W/RFLX CULTURE, ROUTINE
Bilirubin, UA: NEGATIVE
Glucose, UA: NEGATIVE
Ketones, UA: NEGATIVE
Leukocytes,UA: NEGATIVE
Nitrite, UA: NEGATIVE
Protein,UA: NEGATIVE
RBC, UA: NEGATIVE
Specific Gravity, UA: 1.019 (ref 1.005–1.030)
Urobilinogen, Ur: 0.2 mg/dL (ref 0.2–1.0)
pH, UA: 5.5 (ref 5.0–7.5)

## 2023-09-19 LAB — CMP14+EGFR
ALT: 28 IU/L (ref 0–44)
AST: 20 IU/L (ref 0–40)
Albumin: 4.4 g/dL (ref 3.9–4.9)
Alkaline Phosphatase: 80 IU/L (ref 44–121)
BUN/Creatinine Ratio: 12 (ref 10–24)
BUN: 14 mg/dL (ref 8–27)
Bilirubin Total: 0.5 mg/dL (ref 0.0–1.2)
CO2: 23 mmol/L (ref 20–29)
Calcium: 9.3 mg/dL (ref 8.6–10.2)
Chloride: 103 mmol/L (ref 96–106)
Creatinine, Ser: 1.15 mg/dL (ref 0.76–1.27)
Globulin, Total: 2.3 g/dL (ref 1.5–4.5)
Glucose: 81 mg/dL (ref 70–99)
Potassium: 4.2 mmol/L (ref 3.5–5.2)
Sodium: 140 mmol/L (ref 134–144)
Total Protein: 6.7 g/dL (ref 6.0–8.5)
eGFR: 68 mL/min/1.73 (ref >59)

## 2023-09-19 LAB — LIPID PANEL
Chol/HDL Ratio: 4.2 ratio (ref 0.0–5.0)
Cholesterol, Total: 192 mg/dL (ref 100–199)
HDL: 46 mg/dL (ref >39)
LDL Chol Calc (NIH): 119 mg/dL — ABNORMAL HIGH (ref 0–99)
Triglycerides: 151 mg/dL — ABNORMAL HIGH (ref 0–149)
VLDL Cholesterol Cal: 27 mg/dL (ref 5–40)

## 2023-09-19 LAB — TSH+FREE T4
Free T4: 1.32 ng/dL (ref 0.82–1.77)
TSH: 1.59 u[IU]/mL (ref 0.450–4.500)

## 2023-09-19 LAB — CBC WITH DIFFERENTIAL/PLATELET
Basophils Absolute: 0.1 x10E3/uL (ref 0.0–0.2)
Basos: 1 %
EOS (ABSOLUTE): 0.1 x10E3/uL (ref 0.0–0.4)
Eos: 1 %
Hematocrit: 43.9 % (ref 37.5–51.0)
Hemoglobin: 14.5 g/dL (ref 13.0–17.7)
Immature Grans (Abs): 0.1 x10E3/uL (ref 0.0–0.1)
Immature Granulocytes: 1 %
Lymphocytes Absolute: 1.8 x10E3/uL (ref 0.7–3.1)
Lymphs: 20 %
MCH: 30.5 pg (ref 26.6–33.0)
MCHC: 33 g/dL (ref 31.5–35.7)
MCV: 92 fL (ref 79–97)
Monocytes Absolute: 0.6 x10E3/uL (ref 0.1–0.9)
Monocytes: 6 %
Neutrophils Absolute: 6.6 x10E3/uL (ref 1.4–7.0)
Neutrophils: 71 %
Platelets: 168 x10E3/uL (ref 150–450)
RBC: 4.76 x10E6/uL (ref 4.14–5.80)
RDW: 13.3 % (ref 11.6–15.4)
WBC: 9.1 x10E3/uL (ref 3.4–10.8)

## 2023-09-19 LAB — HEMOGLOBIN A1C
Est. average glucose Bld gHb Est-mCnc: 120 mg/dL
Hgb A1c MFr Bld: 5.8 % — ABNORMAL HIGH (ref 4.8–5.6)

## 2023-09-19 LAB — VITAMIN D 25 HYDROXY (VIT D DEFICIENCY, FRACTURES): Vit D, 25-Hydroxy: 46.8 ng/mL (ref 30.0–100.0)

## 2023-09-22 ENCOUNTER — Ambulatory Visit: Payer: Self-pay

## 2023-09-23 ENCOUNTER — Ambulatory Visit: Payer: Self-pay | Admitting: Family Medicine

## 2023-10-09 ENCOUNTER — Ambulatory Visit: Admitting: Neurology

## 2023-10-09 ENCOUNTER — Encounter: Payer: Self-pay | Admitting: Neurology

## 2023-10-27 ENCOUNTER — Encounter: Payer: Self-pay | Admitting: Neurology

## 2023-11-04 ENCOUNTER — Ambulatory Visit: Payer: Medicare Other

## 2023-11-04 VITALS — Ht 70.5 in | Wt 182.0 lb

## 2023-11-04 DIAGNOSIS — Z0000 Encounter for general adult medical examination without abnormal findings: Secondary | ICD-10-CM

## 2023-11-04 NOTE — Progress Notes (Signed)
 Subjective:   Russell Mckinney is a 73 y.o. who presents for a Medicare Wellness preventive visit.  As a reminder, Annual Wellness Visits don't include a physical exam, and some assessments may be limited, especially if this visit is performed virtually. We may recommend an in-person follow-up visit with your provider if needed.  Visit Complete: Virtual I connected with  Arley Roach on 11/04/23 by a audio enabled telemedicine application and verified that I am speaking with the correct person using two identifiers.  Patient Location: Home  Provider Location: Home Office  I discussed the limitations of evaluation and management by telemedicine. The patient expressed understanding and agreed to proceed.  Vital Signs: Because this visit was a virtual/telehealth visit, some criteria may be missing or patient reported. Any vitals not documented were not able to be obtained and vitals that have been documented are patient reported.  VideoDeclined- This patient declined Librarian, academic. Therefore the visit was completed with audio only.  Persons Participating in Visit: Patient.  AWV Questionnaire: No: Patient Medicare AWV questionnaire was not completed prior to this visit.  Cardiac Risk Factors include: advanced age (>58men, >56 women);male gender;dyslipidemia     Objective:    Today's Vitals   11/04/23 1115  Weight: 182 lb (82.6 kg)  Height: 5' 10.5 (1.791 m)   Body mass index is 25.75 kg/m.     11/04/2023   11:17 AM 10/30/2022   10:39 AM 11/26/2021    4:09 PM 09/29/2020    3:05 PM 12/14/2017   11:44 PM 07/28/2015    5:01 PM  Advanced Directives  Does Patient Have a Medical Advance Directive? No No No No No  No   Would patient like information on creating a medical advance directive? No - Patient declined No - Patient declined No - Patient declined No - Patient declined       Data saved with a previous flowsheet row definition    Current  Medications (verified) Outpatient Encounter Medications as of 11/04/2023  Medication Sig   EPINEPHrine  0.3 mg/0.3 mL IJ SOAJ injection Inject 0.3 mg into the skin as directed.   primidone  (MYSOLINE ) 50 MG tablet Take 0.5 tablets (25 mg total) by mouth at bedtime.   rosuvastatin  (CRESTOR ) 10 MG tablet Take 1 tablet (10 mg total) by mouth daily.   sertraline  (ZOLOFT ) 50 MG tablet Take 1 tablet (50 mg total) by mouth daily.   tadalafil  (CIALIS ) 20 MG tablet Take 1 tablet (20 mg total) by mouth as needed for erectile dysfunction. Do not take more than one dose per day. (Patient not taking: Reported on 11/04/2023)   No facility-administered encounter medications on file as of 11/04/2023.    Allergies (verified) Codeine   History: Past Medical History:  Diagnosis Date   Diverticulitis    2017   High cholesterol    High cholesterol    Past Surgical History:  Procedure Laterality Date   TONSILLECTOMY     Family History  Problem Relation Age of Onset   Alzheimer's disease Mother    Diabetes Brother    Social History   Socioeconomic History   Marital status: Widowed    Spouse name: Jentzen Minasyan   Number of children: 0   Years of education: 14   Highest education level: Associate degree: occupational, Scientist, product/process development, or vocational program  Occupational History   Not on file  Tobacco Use   Smoking status: Never    Passive exposure: Never   Smokeless tobacco: Never  Vaping  Use   Vaping status: Never Used  Substance and Sexual Activity   Alcohol use: No   Drug use: No   Sexual activity: Not Currently    Partners: Female    Birth control/protection: None  Other Topics Concern   Not on file  Social History Narrative   Lives home alone. Goes to the church three times a week.       Social Drivers of Corporate investment banker Strain: Low Risk  (11/04/2023)   Overall Financial Resource Strain (CARDIA)    Difficulty of Paying Living Expenses: Not hard at all  Food Insecurity: No  Food Insecurity (11/04/2023)   Hunger Vital Sign    Worried About Running Out of Food in the Last Year: Never true    Ran Out of Food in the Last Year: Never true  Transportation Needs: No Transportation Needs (11/04/2023)   PRAPARE - Administrator, Civil Service (Medical): No    Lack of Transportation (Non-Medical): No  Physical Activity: Sufficiently Active (11/04/2023)   Exercise Vital Sign    Days of Exercise per Week: 7 days    Minutes of Exercise per Session: 30 min  Stress: No Stress Concern Present (11/04/2023)   [employer] of Occupational Health - Occupational Stress Questionnaire    Feeling of Stress: Not at all  Social Connections: Moderately Integrated (11/04/2023)   Social Connection and Isolation Panel    Frequency of Communication with Friends and Family: More than three times a week    Frequency of Social Gatherings with Friends and Family: More than three times a week    Attends Religious Services: More than 4 times per year    Active Member of [employer] or Organizations: Yes    Attends Banker Meetings: More than 4 times per year    Marital Status: Widowed    Tobacco Counseling Counseling given: Yes    Clinical Intake:  Pre-visit preparation completed: Yes  Pain : No/denies pain     BMI - recorded: 25.75 Nutritional Status: BMI 25 -29 Overweight Nutritional Risks: None Diabetes: No  Lab Results  Component Value Date   HGBA1C 5.8 (H) 09/18/2023   HGBA1C 5.9 (H) 11/07/2022   HGBA1C 5.7 (H) 07/08/2022     How often do you need to have someone help you when you read instructions, pamphlets, or other written materials from your doctor or pharmacy?: 1 - Never  Interpreter Needed?: No  Information entered by :: Orlinda Slomski W CMA (AAMA)   Activities of Daily Living     11/04/2023   11:18 AM  In your present state of health, do you have any difficulty performing the following activities:  Hearing? 0  Vision? 0  Difficulty  concentrating or making decisions? 1  Comment per patient sometimes  Walking or climbing stairs? 0  Dressing or bathing? 0  Doing errands, shopping? 0  Preparing Food and eating ? N  Using the Toilet? N  In the past six months, have you accidently leaked urine? N  Do you have problems with loss of bowel control? N  Managing your Medications? N  Managing your Finances? N  Housekeeping or managing your Housekeeping? N    Patient Care Team: Edman Meade PEDLAR, FNP as PCP - General (Family Medicine) Reida Redell PARAS as Counselor (Professional Counselor) Saporito, Joanna D, LCSW as [facility] Care Management (Licensed Clinical Social Worker) Librada, Lauraine POUR, AUD (Audiology)  I have updated your Care Teams any recent Medical Services you may  have received from other providers in the past year.     Assessment:   This is a routine wellness examination for Obi.  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 Has had hearing tested and was told he needed hearing aids. [facility] email to patient during his visit with his permission Vision Screening - Comments:: Wears rx glasses - up to date with routine eye exams with  Dr. Oneil Kawasaki   Goals Addressed               This Visit's Progress     Increase my activity (pt-stated)        I enjoy shooting pool at the senior center but I want to increase my activity        Depression Screen     11/04/2023   11:36 AM 09/18/2023    1:39 PM 09/04/2023   11:07 AM 05/05/2023   11:41 AM 03/21/2023   10:02 AM 02/21/2023   10:11 AM 02/07/2023   10:38 AM  PHQ 2/9 Scores  PHQ - 2 Score 2 2 2 1 2 1 1   PHQ- 9 Score 4 5 5 3 5 4 2      Fall Risk     11/04/2023   11:30 AM 05/05/2023   10:54 AM 02/07/2023   10:10 AM 12/20/2022   10:29 AM 12/20/2022   10:22 AM  Fall Risk   Falls in the past year? 0 0 0 0 0  Number falls in past yr: 0 0 0 0 0  Injury with Fall? 0  0 0 0 0  Risk for fall due to : No Fall Risks No Fall Risks No Fall Risks No Fall Risks No Fall Risks  Follow up Falls evaluation completed;Education provided;Falls prevention discussed Falls evaluation completed Falls evaluation completed Falls evaluation completed Falls evaluation completed    MEDICARE RISK AT HOME:  Medicare Risk at Home Any stairs in or around the home?: Yes If so, are there any without handrails?: No Home free of loose throw rugs in walkways, pet beds, electrical cords, etc?: Yes Adequate lighting in your home to reduce risk of falls?: Yes Life alert?: No Use of a cane, walker or w/c?: No Grab bars in the bathroom?: Yes Shower chair or bench in shower?: Yes Elevated toilet seat or a handicapped toilet?: No  TIMED UP AND GO:  Was the test performed?  No  Cognitive Function: 6CIT completed    09/04/2023   11:52 AM 12/18/2018   10:44 AM  MMSE - Mini Mental State Exam  Orientation to time 5 5  Orientation to Place 5 5  Registration 3 3  Attention/ Calculation 2 5  Recall 0 3  Language- name 2 objects 2 2  Language- repeat 1 1  Language- follow 3 step command 3 3  Language- read & follow direction 1 1  Write a sentence 1 1  Copy design 1 1  Total score 24 30        11/04/2023   11:33 AM 10/30/2022   10:41 AM 10/01/2021   12:05 PM 09/29/2020    3:13 PM  6CIT Screen  What Year? 0 points 0 points 0 points 0 points  What month? 0 points 0 points 0 points 0 points  What time? 0 points 0 points 0 points 0 points  Count back from 20 0 points 0 points 0 points 0 points  Months in reverse 0 points  0 points 0 points 0 points  Repeat phrase 0 points 0 points 0 points 2 points  Total Score 0 points 0 points 0 points 2 points    Immunizations Immunization History  Administered Date(s) Administered   Moderna Sars-Covid-2 Vaccination 10/07/2019, 11/02/2019   Pneumococcal Conjugate-13 12/05/2017   Pneumococcal Polysaccharide-23 06/14/2020   Td (Adult),5 Lf  Tetanus Toxid, Preservative Free 03/12/2006   Tdap 11/17/2015    Screening Tests Health Maintenance  Topic Date Due   Hepatitis C Screening  Never done   Zoster Vaccines- Shingrix (1 of 2) Never done   COVID-19 Vaccine (3 - Moderna risk series) 11/30/2019   Influenza Vaccine  Never done   Fecal DNA (Cologuard)  12/21/2023   Medicare Annual Wellness (AWV)  11/03/2024   DTaP/Tdap/Td (2 - Td or Tdap) 11/16/2025   Pneumococcal Vaccine: 50+ Years  Completed   HPV VACCINES  Aged Out   Meningococcal B Vaccine  Aged Out   Colonoscopy  Discontinued    Health Maintenance Health Maintenance Due  Topic Date Due   Hepatitis C Screening  Never done   Zoster Vaccines- Shingrix (1 of 2) Never done   COVID-19 Vaccine (3 - Moderna risk series) 11/30/2019   Influenza Vaccine  Never done   Health Maintenance Items Addressed: Provider made aware of health maintenance items patient is due for.  Patient is aware of recommended vaccines and where he can have those done at.  Additional Screening:  Vision Screening: Recommended annual ophthalmology exams for early detection of glaucoma and other disorders of the eye. Would you like a referral to an eye doctor? No    Dental Screening: Recommended annual dental exams for proper oral hygiene  Community Resource Referral / Chronic Care Management: CRR required this visit?  No   CCM required this visit?  No   Plan:  Please order, if appropriate, a Hep C screen and Cologuard for patient.   I have personally reviewed and noted the following in the patient's chart:   Medical and social history Use of alcohol, tobacco or illicit drugs  Current medications and supplements including opioid prescriptions. Patient is not currently taking opioid prescriptions. Functional ability and status Nutritional status Physical activity Advanced directives List of other physicians Hospitalizations, surgeries, and ER visits in previous 12  months Vitals Screenings to include cognitive, depression, and falls Referrals and appointments  In addition, I have reviewed and discussed with patient certain preventive protocols, quality metrics, and best practice recommendations. A written personalized care plan for preventive services as well as general preventive health recommendations were provided to patient.   Rafferty Postlewait, CMA   11/04/2023   After Visit Summary: (Mail) Due to this being a telephonic visit, the after visit summary with patients personalized plan was offered to patient via mail   Notes: Nothing significant to report at this time.

## 2023-11-04 NOTE — Patient Instructions (Addendum)
 [name],  Russell Mckinney emailed you several resources today during our visit. Please reach out to them to see how they can assist you.    Thank you for taking the time for your Medicare Wellness Visit. I appreciate your continued commitment to your health goals. Please review the care plan we discussed, and feel free to reach out if I can assist you further.  Medicare recommends these wellness visits once per year to help you and your care team stay ahead of potential health issues. These visits are designed to focus on prevention, allowing your provider to concentrate on managing your acute and chronic conditions during your regular appointments.  Please note that Annual Wellness Visits do not include a physical exam. Some assessments may be limited, especially if the visit was conducted virtually. If needed, we may recommend a separate in-person follow-up with your provider.  Wishing you excellent health and many blessings in the year to come!  -Stellar Gensel, CMA  Ongoing Care Seeing your primary care provider every 3 to 6 months helps us  monitor your health and provide consistent, personalized care.   Recommended Screenings:  Health Maintenance  Topic Date Due   Hepatitis C Screening  Never done   Zoster (Shingles) Vaccine (1 of 2) Never done   COVID-19 Vaccine (3 - Moderna risk series) 11/30/2019   Flu Shot  Never done   Cologuard (Stool DNA test)  12/21/2023   Medicare Annual Wellness Visit  11/03/2024   DTaP/Tdap/Td vaccine (2 - Td or Tdap) 11/16/2025   Pneumococcal Vaccine for age over 50  Completed   HPV Vaccine  Aged Out   Meningitis B Vaccine  Aged Out   Colon Cancer Screening  Discontinued       11/04/2023   11:17 AM  Advanced Directives  Does Patient Have a Medical Advance Directive? No  Would patient like information on creating a medical advance directive? No - Patient declined   Advance Care Planning is important because it: Ensures you receive medical care that aligns with  your values, goals, and preferences. Provides guidance to your family and loved ones, reducing the emotional burden of decision-making during critical moments.  Vision: Annual vision screenings are recommended for early detection of glaucoma, cataracts, and diabetic retinopathy. These exams can also reveal signs of chronic conditions such as diabetes and high blood pressure.  Dental: Annual dental screenings help detect early signs of oral cancer, gum disease, and other conditions linked to overall health, including heart disease and diabetes.  Please see the attached documents for additional preventive care recommendations.

## 2023-11-13 ENCOUNTER — Ambulatory Visit: Payer: Self-pay

## 2023-11-13 NOTE — Telephone Encounter (Signed)
 FYI Only or Action Required?: FYI only for provider.  Patient was last seen in primary care on 09/18/2023 by Russell Sand, NP.  Called Nurse Triage reporting Abdominal Pain.  Symptoms began yesterday.  Interventions attempted: Nothing.  Symptoms are: gradually worsening.  Triage Disposition: See Physician Within 24 Hours  Patient/caregiver understands and will follow disposition?: Yes    Copied from CRM #[id number]. Topic: Clinical - Red Word Triage >> Nov 13, 2023  8:16 AM Russell Mckinney wrote: [facility] that prompted transfer to Nurse Triage: Patient is suffering pain diverticulosis. Reason for Disposition  [1] MILD pain (e.g., does not interfere with normal activities) AND [2] pain comes and goes (cramps) [3] present > 48 hours  (Exception: This same abdominal pain is a chronic symptom recurrent or ongoing AND present > 4 weeks.)    Nurse scheduled appt for tomorrow  Answer Assessment - Initial Assessment Questions 1. LOCATION: Where does it hurt?      Left abd below diaphragm 2. RADIATION: Does the pain shoot anywhere else? (e.g., chest, back)     no 3. ONSET: When did the pain begin? (Minutes, hours or days ago)      yesterday 4. SUDDEN: Gradual or sudden onset?     gradual 5. PATTERN Does the pain come and go, or is it constant?     constant 6. SEVERITY: How bad is the pain?  (e.g., Scale 1-10; mild, moderate, or severe)     6/10 7. RECURRENT SYMPTOM: Have you ever had this type of stomach pain before? If Yes, ask: When was the last time? and What happened that time?      Yes - pt received sterios in the past for this 8. CAUSE: What do you think is causing the stomach pain? (e.g., gallstones, recent abdominal surgery)     diverticulosis. 9. RELIEVING/AGGRAVATING FACTORS: What makes it better or worse? (e.g., antacids, bending or twisting motion, bowel movement)     Moving make the pain worse 10. OTHER SYMPTOMS: Do you have any other symptoms? (e.g.,  back pain, diarrhea, fever, urination pain, vomiting)       Diarrhea   Pt states he has a history of diverticulosis.  Protocols used: Abdominal Pain - Male-A-AH

## 2023-11-13 NOTE — Telephone Encounter (Signed)
Noted, patient scheduled.

## 2023-11-14 ENCOUNTER — Ambulatory Visit: Admitting: Family Medicine

## 2023-11-14 ENCOUNTER — Encounter: Payer: Self-pay | Admitting: Family Medicine

## 2023-11-14 VITALS — BP 120/70 | HR 58 | Ht 70.0 in | Wt 180.0 lb

## 2023-11-14 DIAGNOSIS — K5792 Diverticulitis of intestine, part unspecified, without perforation or abscess without bleeding: Secondary | ICD-10-CM | POA: Diagnosis not present

## 2023-11-14 DIAGNOSIS — D126 Benign neoplasm of colon, unspecified: Secondary | ICD-10-CM

## 2023-11-14 MED ORDER — AMOXICILLIN-POT CLAVULANATE 875-125 MG PO TABS: 1.0000 | ORAL_TABLET | Freq: Two times a day (BID) | ORAL | 0 refills | Status: DC

## 2023-11-16 DIAGNOSIS — K5792 Diverticulitis of intestine, part unspecified, without perforation or abscess without bleeding: Secondary | ICD-10-CM | POA: Insufficient documentation

## 2023-11-16 NOTE — Progress Notes (Signed)
 Subjective:  Patient ID: Russell Mckinney, male    DOB: 1950-04-08  Age: 73 y.o. MRN: [id number]  CC:   Chief Complaint  Patient presents with   Abdominal Pain    Low abdominal pain on left hand side with diarrhea     HPI:  73 year old male with a history of diverticulitis presents for evaluation of abdominal pain.  Started Wednesday night. Location - LLQ. Associated diarrhea. No fever. No chills. Seems to be better today. No other complaints at this time.  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   Constipation 11/08/2013   Hypertriglyceridemia 11/08/2013    Social Hx   Social History   Socioeconomic History   Marital status: Widowed    Spouse name: Rhonin Trott   Number of children: 0   Years of education: 14   Highest education level: Associate degree: occupational, Scientist, product/process development, or vocational program  Occupational History   Not on file  Tobacco Use   Smoking status: Never    Passive exposure: Never   Smokeless tobacco: Never  Vaping Use   Vaping status: Never Used  Substance and Sexual Activity   Alcohol use: No   Drug use: No   Sexual activity: Not Currently    Partners: Female    Birth control/protection: None  Other Topics Concern   Not on file  Social History Narrative   Lives home alone. Goes to the church three times a week.       Social Drivers of Corporate investment banker Strain: Low Risk  (11/04/2023)   Overall Financial Resource Strain (CARDIA)    Difficulty of Paying Living Expenses: Not hard at all  Food Insecurity: No Food Insecurity (11/04/2023)   Hunger Vital Sign    Worried About Running Out of Food in the Last Year: Never true     Ran Out of Food in the Last Year: Never true  Transportation Needs: No Transportation Needs (11/04/2023)   PRAPARE - Administrator, Civil Service (Medical): No    Lack of Transportation (Non-Medical): No  Physical Activity: Sufficiently Active (11/04/2023)   Exercise Vital Sign    Days of Exercise per Week: 7 days    Minutes of Exercise per Session: 30 min  Stress: No Stress Concern Present (11/04/2023)   [employer] of Occupational Health - Occupational Stress Questionnaire    Feeling of Stress: Not at all  Social Connections: Moderately Integrated (11/04/2023)   Social Connection and Isolation Panel    Frequency of Communication with Friends and Family: More than three times a week    Frequency of Social Gatherings with Friends and Family: More than three times a week    Attends Religious Services: More than 4 times per year    Active Member of [employer] or Organizations: Yes    Attends Banker Meetings: More than 4 times per year    Marital Status: Widowed    Review of Systems Per HPI  Objective:  BP 120/70   Pulse (!) 58   Ht 5' 10 (1.778 m)   Wt 180 lb (81.6 kg)   SpO2 98%   BMI 25.83 kg/m      11/14/2023  10:42 AM 11/04/2023   11:15 AM 09/18/2023    1:11 PM  BP/Weight  Systolic BP 120 -- 148  Diastolic BP 70 -- 90  Wt. (Lbs) 180 182 185  BMI 25.83 kg/m2 25.75 kg/m2 25.8 kg/m2    Physical Exam Vitals and nursing note reviewed.  Constitutional:      General: He is not in acute distress.    Appearance: Normal appearance.  HENT:     Head: Normocephalic and atraumatic.  Eyes:     General:        Right eye: No discharge.        Left eye: No discharge.     Conjunctiva/sclera: Conjunctivae normal.  Cardiovascular:     Rate and Rhythm: Normal rate and regular rhythm.  Pulmonary:     Effort: Pulmonary effort is normal.     Breath sounds: Normal breath sounds. No wheezing, rhonchi or rales.  Abdominal:     Palpations: Abdomen is  soft.     Comments: Mild tenderness in the LLQ.  Neurological:     Mental Status: He is alert.  Psychiatric:        Mood and Affect: Mood normal.        Behavior: Behavior normal.     Lab Results  Component Value Date   WBC 9.1 09/18/2023   HGB 14.5 09/18/2023   HCT 43.9 09/18/2023   PLT 168 09/18/2023   GLUCOSE 81 09/18/2023   CHOL 192 09/18/2023   TRIG 151 (H) 09/18/2023   HDL 46 09/18/2023   LDLCALC 119 (H) 09/18/2023   ALT 28 09/18/2023   AST 20 09/18/2023   NA 140 09/18/2023   K 4.2 09/18/2023   CL 103 09/18/2023   CREATININE 1.15 09/18/2023   BUN 14 09/18/2023   CO2 23 09/18/2023   TSH 1.590 09/18/2023   HGBA1C 5.8 (H) 09/18/2023     Assessment & Plan:  Diverticulitis Assessment & Plan: Benjamine diet. Augmentin  if needed. Supportive care.  Orders: -     Amoxicillin -Pot Clavulanate; Take 1 tablet by mouth 2 (two) times daily.  Dispense: 20 tablet; Refill: 0   Follow-up:  Return if symptoms worsen or fail to improve.  Jacqulyn Ahle DO [facility]

## 2023-11-16 NOTE — Assessment & Plan Note (Signed)
 Bland diet. Augmentin  if needed. Supportive care.

## 2023-12-04 ENCOUNTER — Ambulatory Visit: Payer: Self-pay | Admitting: Family Medicine

## 2024-02-04 ENCOUNTER — Ambulatory Visit

## 2024-02-04 VITALS — BP 116/78 | HR 51 | Temp 98.1°F | Resp 16 | Ht 70.0 in | Wt 184.0 lb

## 2024-02-04 DIAGNOSIS — F320 Major depressive disorder, single episode, mild: Secondary | ICD-10-CM | POA: Diagnosis not present

## 2024-02-04 DIAGNOSIS — E781 Pure hyperglyceridemia: Secondary | ICD-10-CM

## 2024-02-04 DIAGNOSIS — E782 Mixed hyperlipidemia: Secondary | ICD-10-CM

## 2024-02-04 MED ORDER — SERTRALINE HCL 50 MG PO TABS: 50.0000 mg | ORAL_TABLET | Freq: Every day | ORAL | 3 refills | Status: DC

## 2024-02-04 MED ORDER — ROSUVASTATIN CALCIUM 10 MG PO TABS: 10.0000 mg | ORAL_TABLET | Freq: Every day | ORAL | 3 refills | Status: AC

## 2024-02-04 NOTE — Progress Notes (Unsigned)
 "  Established Patient Office Visit  Subjective   Patient ID: Marshel Golubski, male    DOB: 1950-05-17  Age: 73 y.o. MRN: [id number]  Chief Complaint  Patient presents with   Cyst    There was a cyst on the front of his left shoulder that burst the 28th and there was some pus and some blood and he wants to make sure everything looks ok   Nasal Congestion    X 2 days, some cough and head congestion. Has been taking Dayquil     HPI Discussed the use of AI scribe software for clinical note transcription with the patient, who gave verbal consent to proceed.  History of Present Illness    Winifred Balogh is a 73 year old male who presents with congestion and cough.  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 - Initially resembled a pimple and has slowly enlarged - Oozing from the cyst, managed with a Band-Aid - History of three prior cyst removals, with the first occurring in high school  Medication refill needs - Needs refills for rosuvastatin  and sertraline  - One medication has been out for over a month     Patient Active Problem List   Diagnosis Date Noted   Mixed hyperlipidemia 02/06/2024   Diverticulitis 11/16/2023   Tremors of nervous system 02/07/2023   Depression, major, single episode, mild 11/09/2022   History of anaphylaxis 06/14/2020   Situational anxiety 12/18/2018   Sleep difficulties 12/18/2018   Other male erectile dysfunction 12/18/2018   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   Constipation 11/08/2013   Hypertriglyceridemia 11/08/2013    ROS    Objective:     BP 116/78 (BP Location: Left Arm, Patient Position: Sitting, Cuff Size: Normal)   Pulse (!) 51   Temp 98.1 F (36.7 C)  (Oral)   Resp 16   Ht 5' 10 (1.778 m)   Wt 184 lb (83.5 kg)   SpO2 95%   BMI 26.40 kg/m  BP Readings from Last 3 Encounters:  02/04/24 116/78  11/14/23 120/70  09/18/23 (!) 148/90   Wt Readings from Last 3 Encounters:  02/04/24 184 lb (83.5 kg)  11/14/23 180 lb (81.6 kg)  11/04/23 182 lb (82.6 kg)     Physical Exam Vitals and nursing note reviewed.  Constitutional:      Appearance: Normal appearance.  HENT:     Head: Normocephalic.     Right Ear: Tympanic membrane, ear canal and external ear normal.     Left Ear: Tympanic membrane, ear canal and external ear normal.     Nose: Rhinorrhea present. No congestion.     Right Turbinates: Enlarged.     Left Turbinates: Enlarged.     Right Sinus: No maxillary sinus tenderness or frontal sinus tenderness.     Left Sinus: No maxillary sinus tenderness or frontal sinus tenderness.     Mouth/Throat:     Mouth: Mucous membranes are moist.     Pharynx: Oropharynx is clear.  Eyes:     Extraocular Movements: Extraocular movements intact.     Pupils: Pupils are equal, round, and reactive to light.  Cardiovascular:     Rate and Rhythm: Normal rate and regular  rhythm.  Pulmonary:     Effort: Pulmonary effort is normal.     Breath sounds: Normal breath sounds.  Musculoskeletal:     Cervical back: Normal range of motion and neck supple.  Skin:    General: Skin is warm and dry.  Neurological:     Mental Status: He is alert and oriented to person, place, and time.  Psychiatric:        Mood and Affect: Mood normal.        Thought Content: Thought content normal.     No results found for any visits on 02/04/24.    The 10-year ASCVD risk score (Arnett DK, et al., 2019) is: 16.8%    Assessment & Plan:   Problem List Items Addressed This Visit       Other   Hypertriglyceridemia - Primary   Currently managed with rosuvastatin  10 mg daily       Relevant Medications   rosuvastatin  (CRESTOR ) 10 MG tablet   Depression,  major, single episode, mild   Patient reports depression is well-controlled with sertraline  50 mg.  No medication changes made today.  Refills provided.      Relevant Medications   sertraline  (ZOLOFT ) 50 MG tablet    Return in about 6 months (around 08/03/2024) for chronic follow-up with PCP, as needed.    Leita Longs, FNP  "

## 2024-02-06 DIAGNOSIS — E782 Mixed hyperlipidemia: Secondary | ICD-10-CM | POA: Insufficient documentation

## 2024-02-06 NOTE — Assessment & Plan Note (Signed)
 Patient reports depression is well-controlled with sertraline  50 mg.  No medication changes made today.  Refills provided.

## 2024-02-06 NOTE — Assessment & Plan Note (Signed)
 Currently managed with rosuvastatin  10 mg daily

## 2024-02-26 ENCOUNTER — Encounter: Payer: Self-pay | Admitting: Neurology

## 2024-02-26 ENCOUNTER — Ambulatory Visit: Admitting: Neurology

## 2024-02-26 VITALS — BP 140/98 | HR 52 | Ht 70.0 in | Wt 186.4 lb

## 2024-02-26 DIAGNOSIS — R030 Elevated blood-pressure reading, without diagnosis of hypertension: Secondary | ICD-10-CM | POA: Diagnosis not present

## 2024-02-26 DIAGNOSIS — G20C Parkinsonism, unspecified: Secondary | ICD-10-CM

## 2024-02-26 DIAGNOSIS — R4189 Other symptoms and signs involving cognitive functions and awareness: Secondary | ICD-10-CM | POA: Diagnosis not present

## 2024-02-26 NOTE — Patient Instructions (Signed)
 It was nice to meet you today.  As discussed, you may have Parkinson's disease.  This condition can affect your balance, your memory, your posture, your fine motor skills and cause tremors, also sleep disturbances and mood disturbance.  It is a progressive condition and we will continue to monitor.    Parkinson's disease and parkinsonism can affect your balance, your memory, your mood, your bowel and bladder function, your posture, balance and walking and your activities of daily living. Some people have a tremor and some don't.  There is unfortunately no curative treatment. However, we can consider several different supportive and symptomatic treatment.  Remember to drink plenty of fluid at least 6 or 8 glasses (8 oz each), eat healthy meals and do not skip any meals. Try to eat protein with a every meal and eat a healthy snack such as fruit or nuts in between meals. Try to keep a regular sleep-wake schedule and try to exercise daily, particularly in the form of walking, 20-30 minutes a day, if you can.  Try to stay active physically and mentally. Engage in social activities in your community and with your family and try to keep up with current events by reading the newspaper or watching the news. Try to do word puzzles and you may like to do puzzles and brain games on the computer such as on [email or web address].  As far as your medications are concerned, I would like hold off on any meds at this time. I recommend you stop the primidone  at this point.  As far as diagnostic testing, I would like to suggest a so called DaT scan: This is a specialized brain scan designed to help with diagnosis of tremor disorders. A radioactive marker gets injected and the uptake is measured in the brain and compared to normal controls and right side is compared to the left, a change in uptake can help with diagnosis of certain tremor disorders. A brain MRI on the other hand is a brain scan that helps look at the brain structure in  more detail overall and look for age-related changes, blood vessel related changes and look for stroke and volume loss which we call atrophy.  I have provided you with additional information about the scan.  If you would like to proceed, please call our office or send us  a MyChart message.  I would like to see you back in 4 months, sooner if we need to. Please call us  with any interim questions, concerns, problems, updates or refill requests.  Our phone number is [phone]. We also have an after hours call service for urgent matters and there is a physician on-call for urgent questions, that cannot wait till the next work day. For any emergencies you know to call 911 or go to the nearest emergency room. You can email me through my chart and also leave a phone message for one of our nurses.  We may consider a brain MRI down the road as well.

## 2024-02-26 NOTE — Progress Notes (Signed)
 Subjective:    Patient ID: Russell Mckinney is a 74 y.o. male.  HPI    True Mar, MD, PhD [facility] [address], [address] [address] [place], [place] [address]  Dear Meade,  I saw your patient, Russell Mckinney, upon your kind request in my neurologic clinic today for evaluation of his tremor.  He also has had some cognitive concerns in the past few months..  The patient is unaccompanied today.  As you know, Russell Mckinney is a 74 year old male with an underlying medical history of impaired fasting glucose, vitamin D  deficiency, diverticulitis, hyperlipidemia, abnormal thyroid  function, and mildly overweight state, who reports a several month history of tremors affecting both hands. He reports that his tremor started originally at rest on the left side and then affected his right hand.  He has noticed a change in his posture.  He has not fallen.  He denies any sudden onset one-sided weakness or numbness or tingling or droopy face or slurring of speech.  He is working on his hydration, estimates that he currently drinks about 40 ounces of water per day.  He drinks sweet tea and is thinking about going half-and-half.  He drinks about 2 bottles of sweet tea per day.  He does not drink any alcohol.  He does not smoke. I reviewed your office note from 09/04/2023.  He reported forgetfulness at the time.  He reported a hand tremor which initially started on the left side, then affected his right hand.  His MMSE at the time was 24 out of 30.  MoCA score today was 19.  He was advised to start primidone  25 mg daily.  Blood tests were ordered.  He had blood work on 09/18/2023 and I reviewed test results in his electronic chart.  A1c was 5.8, in the prediabetes range, vitamin D  46.8, TSH normal at 1.59, free T4 in the normal range.  Lipid panel showed total cholesterol of 192, triglycerides 151, LDL mildly elevated at 119, CMP benign with normal AST and ALT, CBC with differential normal. He has not  had any brain imaging.  He does not believe that the primidone  has helped.  He has no family history of Parkinson's disease or tremor.  He is widowed and has no children.  He lives in a mobile home and has no pets.  He does have a significant other/girlfriend who advised him to ask about medications including carbidopa-levodopa and levetiracetam.  He has those 2 names of medications on a piece of paper.   He is trying to exercise on a regular basis, has started going to the senior center.  He is driving okay.  He does have an eye appointment today with [facility].  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   Diverticulitis    2017   High cholesterol    High cholesterol     His Past Surgical History Is Significant For: Past Surgical History:  Procedure Laterality Date   TONSILLECTOMY      His Family History Is Significant For: Family History  Problem Relation Age of Onset   Alzheimer's disease Mother    Diabetes Brother     His Social History Is Significant For: Social History   Socioeconomic History   Marital status: Widowed    Spouse name: Darus Hershman   Number of children: 0   Years of education: 14   Highest education level: Associate degree:  occupational, scientist, product/process development, or vocational program  Occupational History   Not on file  Tobacco Use   Smoking status: Never    Passive exposure: Never   Smokeless tobacco: Never  Vaping Use   Vaping status: Never Used  Substance and Sexual Activity   Alcohol use: No   Drug use: No   Sexual activity: Not Currently    Partners: Female    Birth control/protection: None  Other Topics Concern   Not on file  Social History Narrative   Lives home alone. Goes to the church three times a week. Patient is retired.       Social Drivers of Health   Tobacco Use: Low Risk (02/26/2024)   Patient History    Smoking Tobacco Use: Never    Smokeless Tobacco  Use: Never    Passive Exposure: Never  Financial Resource Strain: Low Risk (11/04/2023)   Overall Financial Resource Strain (CARDIA)    Difficulty of Paying Living Expenses: Not hard at all  Food Insecurity: No Food Insecurity (11/04/2023)   Epic    Worried About Programme Researcher, Broadcasting/film/video in the Last Year: Never true    Ran Out of Food in the Last Year: Never true  Transportation Needs: No Transportation Needs (11/04/2023)   Epic    Lack of Transportation (Medical): No    Lack of Transportation (Non-Medical): No  Physical Activity: Sufficiently Active (11/04/2023)   Exercise Vital Sign    Days of Exercise per Week: 7 days    Minutes of Exercise per Session: 30 min  Stress: No Stress Concern Present (11/04/2023)   [employer] of Occupational Health - Occupational Stress Questionnaire    Feeling of Stress: Not at all  Social Connections: Moderately Integrated (11/04/2023)   Social Connection and Isolation Panel    Frequency of Communication with Friends and Family: More than three times a week    Frequency of Social Gatherings with Friends and Family: More than three times a week    Attends Religious Services: More than 4 times per year    Active Member of [employer] or Organizations: Yes    Attends Banker Meetings: More than 4 times per year    Marital Status: Widowed  Depression (PHQ2-9): Low Risk (02/04/2024)   Depression (PHQ2-9)    PHQ-2 Score: 1  Alcohol Screen: Low Risk (11/04/2023)   Alcohol Screen    Last Alcohol Screening Score (AUDIT): 0  Housing: Low Risk (11/04/2023)   Epic    Unable to Pay for Housing in the Last Year: No    Number of Times Moved in the Last Year: 0    Homeless in the Last Year: No  Utilities: Not At Risk (11/04/2023)   Epic    Threatened with loss of utilities: No  Health Literacy: Adequate Health Literacy (11/04/2023)   B1300 Health Literacy    Frequency of need for help with medical instructions: Never    His Allergies Are:  Allergies[1]:    His Current Medications Are:  Outpatient Encounter Medications as of 02/26/2024  Medication Sig   EPINEPHrine  0.3 mg/0.3 mL IJ SOAJ injection Inject 0.3 mg into the skin as directed.   primidone  (MYSOLINE ) 50 MG tablet Take 0.5 tablets (25 mg total) by mouth at bedtime.   rosuvastatin  (CRESTOR ) 10 MG tablet Take 1 tablet (10 mg total) by mouth daily.   sertraline  (ZOLOFT ) 50 MG tablet Take 1 tablet (50 mg total) by mouth daily.   No facility-administered encounter medications on file as of  02/26/2024.  :   Review of Systems:  Out of a complete 14 point review of systems, all are reviewed and negative with the exception of these symptoms as listed below:   Review of Systems  Objective:  Neurological Exam  Physical Exam Physical Examination:   Vitals:   02/26/24 0817 02/26/24 0830  BP: (!) 162/84 (!) 162/84  Pulse: (!) 52     General Examination: The patient is a very pleasant 74 y.o. male in no acute distress. He appears well-developed and well-nourished and adequately groomed.   HEENT: Normocephalic, atraumatic, pupils are equal, round and reactive to light, extraocular tracking is good without limitation to gaze excursion or nystagmus noted. No photophobia.  No corrective eyeglasses.  He has a decreased eye blink rate and mild facial masking, mild nuchal rigidity noted, no carotid bruits.  Airway examination reveals mild mouth dryness, tongue protrudes centrally and palate elevates symmetrically. Extraocular tracking shows mild saccadic breakdown.  He has bilateral cataracts.  Decreased hearing noted.  Chest: Clear to auscultation without wheezing, rhonchi or crackles noted.  Heart: S1+S2+0, regular and normal without murmurs, rubs or gallops noted.   Abdomen: Soft, non-tender and non-distended.  Extremities: There is no pitting edema in the distal lower extremities bilaterally.   Skin: Warm and dry without trophic changes noted.   Musculoskeletal: exam reveals no  obvious joint deformities.   Neurologically:  Mental status: The patient is awake, pays attention but has trouble relating chronological events and details of his history.   Speech is slightly slow and not particularly hypophonic, not dysarthric.        02/26/2024    8:21 AM  Montreal Cognitive Assessment   Visuospatial/ Executive (0/5) 4  Naming (0/3) 3  Attention: Read list of digits (0/2) 2  Attention: Read list of letters (0/1) 1  Attention: Serial 7 subtraction starting at 100 (0/3) 1  Language: Repeat phrase (0/2) 0  Language : Fluency (0/1) 0  Abstraction (0/2) 2  Delayed Recall (0/5) 0  Orientation (0/6) 6  Total 19     Cranial nerves II - XII are as described above under HEENT exam.  Motor exam: Normal bulk, strength, tone is slightly increased in both upper extremities particularly in the wrist. There is no obvious action tremor.  He has an intermittent resting tremor in the left hand only.  Mild bradykinesia noted.   On 02/26/2024: On Archimedes spiral drawing he has minimal trembling with the right hand and mild insecurity but no trembling with the left hand, handwriting is on the smaller side, legible, slightly tremulous.  Fine motor skills and coordination: Mild impairment of finger taps, hand movements and rapid alternating patting, more noticeable on the left, mild impairment of foot taps, more noticeable on the left..  Cerebellar testing: No dysmetria or intention tremor. There is no truncal or gait ataxia.  Sensory exam: intact to light touch in the upper and lower extremities.  Gait, station and balance: He stands without difficulty, posture is mildly stooped for age, he walks slowly with decreased arm swing left more than right.  He turns slowly.   Assessment and Plan:  In summary, Russell Mckinney is a 74 year old male with an underlying medical history of impaired fasting glucose, vitamin D  deficiency, diverticulitis, hyperlipidemia, abnormal thyroid  function, and  mildly overweight state, who presents for evaluation of his tremor disorder of several months duration, he has also noticed cognitive changes probably also over the past several months.  His MoCA score is in the  mildly abnormal range, and we will continue to monitor.  History, symptoms, description of his progression and examination are in keeping with parkinsonism.  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  I had a long discussion with the patient today regarding the nature of the condition, progression, symptomatic treatment and importance of lifestyle modification.    We discussed different options.  He is advised to stop the primidone  at this time.  He is advised to get in touch with your office regarding his elevated blood pressure.  He does report that he has been anxious lately.  He was also somewhat anxious about this appointment today. We mutually agreed to hold off on any new medications at this time and have him work on lifestyle modification.  In particular, he is encouraged to increase his water intake and limit his caffeine intake and increase his exercise regimen.  This was an extended visit of over 70 minutes addressing multiple issues, comprehensive exam, memory testing and interpretation, considerable counseling and coordination of care.    Below is a summary of my recommendations and our discussion points from today's visit, based on chart review, history and examination. They were given these instructions verbally during the visit in detail.  We will also mail a copy to his address. <<  Parkinson's disease and parkinsonism can affect your balance, your memory, your mood, your bowel and bladder function, your posture, balance and walking and your activities of daily living. Some people have a tremor and some don't.  There is unfortunately no curative treatment. However, we can consider several different supportive and symptomatic treatment.  Remember to drink plenty of  fluid at least 6 or 8 glasses (8 oz each), eat healthy meals and do not skip any meals. Try to eat protein with a every meal and eat a healthy snack such as fruit or nuts in between meals. Try to keep a regular sleep-wake schedule and try to exercise daily, particularly in the form of walking, 20-30 minutes a day, if you can.  Try to stay active physically and mentally. Engage in social activities in your community and with your family and try to keep up with current events by reading the newspaper or watching the news. Try to do word puzzles and you may like to do puzzles and brain games on the computer such as on [email or web address].  As far as your medications are concerned, I would like hold off on any meds at this time. I recommend you stop the primidone  at this point.  As far as diagnostic testing, I would like to suggest a so called DaT scan: This is a specialized brain scan designed to help with diagnosis of tremor disorders. A radioactive marker gets injected and the uptake is measured in the brain and compared to normal controls and right side is compared to the left, a change in uptake can help with diagnosis of certain tremor disorders. A brain MRI on the other hand is a brain scan that helps look at the brain structure in more detail overall and look for age-related changes, blood vessel related changes and look for stroke and volume loss which we call atrophy.  I have provided you with additional information about the scan.  If you would like to proceed, please call our office or send us  a MyChart message.  I would like to see you back in 4 months, sooner if we need to. Please call us  with any interim questions, concerns, problems, updates or refill requests.  Our phone number is [phone]. We also have an after hours call service for urgent matters and there is a physician on-call for urgent questions, that cannot wait till the next work day. For any emergencies you know to call 911 or go to the  nearest emergency room. You can email me through my chart and also leave a phone message for one of our nurses.  We may consider a brain MRI down the road as well.>>    Thank you very much for allowing me to participate in the care of this nice patient. If I can be of any further assistance to you please do not hesitate to call me at [phone].  Sincerely,   True Mar, MD, PhD      [1]  Allergies Allergen Reactions   Codeine Other (See Comments) and Anaphylaxis    Other reaction(s): Other (See Comments)  GI Upset   GI Upset

## 2024-03-08 ENCOUNTER — Ambulatory Visit: Admitting: Family Medicine

## 2024-03-10 ENCOUNTER — Ambulatory Visit: Admitting: Nurse Practitioner

## 2024-03-10 ENCOUNTER — Encounter: Payer: Self-pay | Admitting: Nurse Practitioner

## 2024-03-10 VITALS — BP 125/76 | HR 62 | Ht 70.0 in | Wt 162.0 lb

## 2024-03-10 DIAGNOSIS — G20A1 Parkinson's disease without dyskinesia, without mention of fluctuations: Secondary | ICD-10-CM | POA: Diagnosis not present

## 2024-03-10 DIAGNOSIS — F418 Other specified anxiety disorders: Secondary | ICD-10-CM | POA: Diagnosis not present

## 2024-03-10 DIAGNOSIS — Z87892 Personal history of anaphylaxis: Secondary | ICD-10-CM

## 2024-03-10 DIAGNOSIS — E782 Mixed hyperlipidemia: Secondary | ICD-10-CM

## 2024-03-10 DIAGNOSIS — T7840XD Allergy, unspecified, subsequent encounter: Secondary | ICD-10-CM

## 2024-03-10 DIAGNOSIS — Z125 Encounter for screening for malignant neoplasm of prostate: Secondary | ICD-10-CM | POA: Diagnosis not present

## 2024-03-10 DIAGNOSIS — F320 Major depressive disorder, single episode, mild: Secondary | ICD-10-CM

## 2024-03-10 DIAGNOSIS — K5790 Diverticulosis of intestine, part unspecified, without perforation or abscess without bleeding: Secondary | ICD-10-CM | POA: Diagnosis not present

## 2024-03-10 DIAGNOSIS — K5792 Diverticulitis of intestine, part unspecified, without perforation or abscess without bleeding: Secondary | ICD-10-CM

## 2024-03-10 MED ORDER — EPINEPHRINE 0.3 MG/0.3ML IJ SOAJ: 0.3000 mg | INTRAMUSCULAR | 1 refills | Status: AC

## 2024-03-10 MED ORDER — SERTRALINE HCL 50 MG PO TABS: 50.0000 mg | ORAL_TABLET | Freq: Every day | ORAL | 1 refills | Status: AC

## 2024-03-10 NOTE — Patient Instructions (Signed)

## 2024-03-11 ENCOUNTER — Ambulatory Visit: Payer: Self-pay | Admitting: Nurse Practitioner

## 2024-03-11 LAB — CMP14+EGFR
ALT: 13 [IU]/L (ref 0–44)
AST: 15 [IU]/L (ref 0–40)
Albumin: 4.5 g/dL (ref 3.8–4.8)
Alkaline Phosphatase: 53 [IU]/L (ref 47–123)
BUN/Creatinine Ratio: 13 (ref 10–24)
BUN: 17 mg/dL (ref 8–27)
Bilirubin Total: 0.7 mg/dL (ref 0.0–1.2)
CO2: 22 mmol/L (ref 20–29)
Calcium: 9.7 mg/dL (ref 8.6–10.2)
Chloride: 104 mmol/L (ref 96–106)
Creatinine, Ser: 1.31 mg/dL — ABNORMAL HIGH (ref 0.76–1.27)
Globulin, Total: 2.5 g/dL (ref 1.5–4.5)
Glucose: 82 mg/dL (ref 70–99)
Potassium: 4.6 mmol/L (ref 3.5–5.2)
Sodium: 142 mmol/L (ref 134–144)
Total Protein: 7 g/dL (ref 6.0–8.5)
eGFR: 58 mL/min/{1.73_m2} — ABNORMAL LOW

## 2024-03-11 LAB — LIPID PANEL
Chol/HDL Ratio: 6.4 ratio — ABNORMAL HIGH (ref 0.0–5.0)
Cholesterol, Total: 254 mg/dL — ABNORMAL HIGH (ref 100–199)
HDL: 40 mg/dL
LDL Chol Calc (NIH): 186 mg/dL — ABNORMAL HIGH (ref 0–99)
Triglycerides: 153 mg/dL — ABNORMAL HIGH (ref 0–149)
VLDL Cholesterol Cal: 28 mg/dL (ref 5–40)

## 2024-03-11 LAB — CBC WITH DIFFERENTIAL/PLATELET
Basophils Absolute: 0.1 10*3/uL (ref 0.0–0.2)
Basos: 1 %
EOS (ABSOLUTE): 0 10*3/uL (ref 0.0–0.4)
Eos: 0 %
Hematocrit: 44.5 % (ref 37.5–51.0)
Hemoglobin: 14.4 g/dL (ref 13.0–17.7)
Immature Grans (Abs): 0 10*3/uL (ref 0.0–0.1)
Immature Granulocytes: 0 %
Lymphocytes Absolute: 1.6 10*3/uL (ref 0.7–3.1)
Lymphs: 24 %
MCH: 29.6 pg (ref 26.6–33.0)
MCHC: 32.4 g/dL (ref 31.5–35.7)
MCV: 91 fL (ref 79–97)
Monocytes Absolute: 0.5 10*3/uL (ref 0.1–0.9)
Monocytes: 7 %
Neutrophils Absolute: 4.6 10*3/uL (ref 1.4–7.0)
Neutrophils: 68 %
Platelets: 197 10*3/uL (ref 150–450)
RBC: 4.87 x10E6/uL (ref 4.14–5.80)
RDW: 13.5 % (ref 11.6–15.4)
WBC: 6.8 10*3/uL (ref 3.4–10.8)

## 2024-03-11 LAB — PSA, TOTAL AND FREE
PSA, Free Pct: 25 %
PSA, Free: 0.2 ng/mL
Prostate Specific Ag, Serum: 0.8 ng/mL (ref 0.0–4.0)

## 2024-06-30 ENCOUNTER — Ambulatory Visit: Admitting: Neurology

## 2024-09-08 ENCOUNTER — Ambulatory Visit: Payer: Self-pay | Admitting: Nurse Practitioner

## 2024-11-08 ENCOUNTER — Ambulatory Visit
